# Patient Record
Sex: Female | Born: 1970 | Hispanic: Yes | Marital: Married | Smoking: Never smoker
Health system: Southern US, Community
[De-identification: ages and names within clinical notes are randomized; demographics above are authoritative.]

## PROBLEM LIST (undated history)

## (undated) DIAGNOSIS — I10 Essential (primary) hypertension: Secondary | ICD-10-CM

## (undated) DIAGNOSIS — E785 Hyperlipidemia, unspecified: Secondary | ICD-10-CM

## (undated) DIAGNOSIS — G4730 Sleep apnea, unspecified: Secondary | ICD-10-CM

---

## 2020-03-31 ENCOUNTER — Other Ambulatory Visit: Payer: Self-pay

## 2020-03-31 ENCOUNTER — Emergency Department
Admission: EM | Admit: 2020-03-31 | Discharge: 2020-03-31 | Disposition: A | Discharge status: Home / Self Care | Payer: HRSA Program | Attending: Emergency Medicine | Admitting: Emergency Medicine

## 2020-03-31 ENCOUNTER — Encounter: Payer: Self-pay | Admitting: Intensive Care

## 2020-03-31 ENCOUNTER — Emergency Department: Payer: HRSA Program

## 2020-03-31 DIAGNOSIS — J069 Acute upper respiratory infection, unspecified: Secondary | ICD-10-CM | POA: Insufficient documentation

## 2020-03-31 DIAGNOSIS — U071 COVID-19: Secondary | ICD-10-CM | POA: Diagnosis not present

## 2020-03-31 DIAGNOSIS — J029 Acute pharyngitis, unspecified: Secondary | ICD-10-CM | POA: Diagnosis present

## 2020-03-31 LAB — RESPIRATORY PANEL BY RT PCR (FLU A&B, COVID)
Influenza A by PCR: NEGATIVE
Influenza B by PCR: NEGATIVE
SARS Coronavirus 2 by RT PCR: POSITIVE — AB

## 2020-03-31 MED ORDER — ONDANSETRON 4 MG PO TBDP: 4.0000 mg | ORAL_TABLET | Freq: Three times a day (TID) | ORAL | 0 refills | Status: AC | PRN

## 2020-03-31 MED ORDER — ALBUTEROL SULFATE HFA 108 (90 BASE) MCG/ACT IN AERS: 2.0000 | INHALATION_SPRAY | Freq: Four times a day (QID) | RESPIRATORY_TRACT | 2 refills | Status: DC | PRN

## 2020-03-31 MED ORDER — BENZONATATE 100 MG PO CAPS: 100.0000 mg | ORAL_CAPSULE | Freq: Three times a day (TID) | ORAL | 0 refills | Status: AC | PRN

## 2020-03-31 MED ORDER — ACETAMINOPHEN 325 MG PO TABS
650.0000 mg | ORAL_TABLET | Freq: Once | ORAL | Status: AC | PRN
  Administered 2020-03-31: 650 mg via ORAL
  Filled 2020-03-31: qty 2

## 2020-03-31 MED ORDER — BENZONATATE 100 MG PO CAPS: 100.0000 mg | ORAL_CAPSULE | Freq: Three times a day (TID) | ORAL | 0 refills | Status: DC | PRN

## 2020-03-31 NOTE — ED Triage Notes (Signed)
Pt c/o sore throat, chills, body aches, headache, and fever that started last Wednesday.

## 2020-03-31 NOTE — ED Notes (Signed)
sicke with congestion, runny nose, sore throat. One day low grade fever, aches since Wednesday.

## 2020-03-31 NOTE — ED Provider Notes (Signed)
Emergency Department Provider Note  ____________________________________________  Time seen: Approximately 7:57 PM  I have reviewed the triage vital signs and the nursing notes.   HISTORY  Chief Complaint Sore Throat and Chills   Historian Patient   HPI Julia Watson is a 49 y.o. female presents to the emergency department with nasal congestion, rhinorrhea, pharyngitis and low-grade fever that started 4 to 5 days ago.  Patient denies chest pain, chest tightness or abdominal pain.  She has potential sick contacts within her community.  No sick contacts within the home.  No other alleviating measures have been attempted.   History reviewed. No pertinent past medical history.   Immunizations up to date:  Yes.     History reviewed. No pertinent past medical history.  There are no problems to display for this patient.   History reviewed. No pertinent surgical history.  Prior to Admission medications   Medication Sig Start Date End Date Taking? Authorizing Provider  albuterol (VENTOLIN HFA) 108 (90 Base) MCG/ACT inhaler Inhale 2 puffs into the lungs every 6 (six) hours as needed for wheezing or shortness of breath. 03/31/20   Orvil Feil, PA-C  benzonatate (TESSALON PERLES) 100 MG capsule Take 1 capsule (100 mg total) by mouth 3 (three) times daily as needed for up to 7 days for cough. 03/31/20 04/07/20  Orvil Feil, PA-C  ondansetron (ZOFRAN ODT) 4 MG disintegrating tablet Take 1 tablet (4 mg total) by mouth every 8 (eight) hours as needed for up to 5 days. 03/31/20 04/05/20  Orvil Feil, PA-C    Allergies Patient has no known allergies.  History reviewed. No pertinent family history.  Social History Social History   Tobacco Use  . Smoking status: Never Smoker  . Smokeless tobacco: Never Used  Substance Use Topics  . Alcohol use: Never  . Drug use: Never     Review of Systems  Constitutional: Patient has fever.  Eyes: No visual changes. No  discharge ENT: Patient has congestion.  Cardiovascular: no chest pain. Respiratory: Patient has cough.  Gastrointestinal: No abdominal pain.  No nausea, no vomiting. Patient had diarrhea.  Genitourinary: Negative for dysuria. No hematuria Musculoskeletal: Patient has myalgias.  Skin: Negative for rash, abrasions, lacerations, ecchymosis. Neurological: Patient has headache, no focal weakness or numbness.       ____________________________________________   PHYSICAL EXAM:  VITAL SIGNS: ED Triage Vitals  Enc Vitals Group     BP 03/31/20 1534 (!) 138/103     Pulse Rate 03/31/20 1534 (!) 102     Resp 03/31/20 1534 16     Temp 03/31/20 1534 (!) 100.4 F (38 C)     Temp Source 03/31/20 1534 Oral     SpO2 03/31/20 1534 100 %     Weight 03/31/20 1532 115 lb (52.2 kg)     Height 03/31/20 1532 5\' 1"  (1.549 m)     Head Circumference --      Peak Flow --      Pain Score 03/31/20 1534 8     Pain Loc --      Pain Edu? --      Excl. in GC? --     Constitutional: Alert and oriented. Patient is lying supine. Eyes: Conjunctivae are normal. PERRL. EOMI. Head: Atraumatic. ENT:      Ears: Tympanic membranes are mildly injected with mild effusion bilaterally.       Nose: No congestion/rhinnorhea.      Mouth/Throat: Mucous membranes are moist. Posterior pharynx is mildly  erythematous.  Hematological/Lymphatic/Immunilogical: No cervical lymphadenopathy.  Cardiovascular: Normal rate, regular rhythm. Normal S1 and S2.  Good peripheral circulation. Respiratory: Normal respiratory effort without tachypnea or retractions. Lungs CTAB. Good air entry to the bases with no decreased or absent breath sounds. Gastrointestinal: Bowel sounds 4 quadrants. Soft and nontender to palpation. No guarding or rigidity. No palpable masses. No distention. No CVA tenderness. Musculoskeletal: Full range of motion to all extremities. No gross deformities appreciated. Neurologic:  Normal speech and language. No  gross focal neurologic deficits are appreciated.  Skin:  Skin is warm, dry and intact. No rash noted. Psychiatric: Mood and affect are normal. Speech and behavior are normal. Patient exhibits appropriate insight and judgement.    ____________________________________________   LABS (all labs ordered are listed, but only abnormal results are displayed)  Labs Reviewed  RESPIRATORY PANEL BY RT PCR (FLU A&B, COVID) - Abnormal; Notable for the following components:      Result Value   SARS Coronavirus 2 by RT PCR POSITIVE (*)    All other components within normal limits   ____________________________________________  EKG   ____________________________________________  RADIOLOGY Geraldo Pitter, personally viewed and evaluated these images (plain radiographs) as part of my medical decision making, as well as reviewing the written report by the radiologist.  No consolidations, opacities or infiltrates.  No results found.  ____________________________________________    PROCEDURES  Procedure(s) performed:     Procedures     Medications  acetaminophen (TYLENOL) tablet 650 mg (650 mg Oral Given 03/31/20 1538)     ____________________________________________   INITIAL IMPRESSION / ASSESSMENT AND PLAN / ED COURSE  Pertinent labs & imaging results that were available during my care of the patient were reviewed by me and considered in my medical decision making (see chart for details).      Assessment and plan COVID-[age] year old female presents to the emergency department with rhinorrhea, nasal congestion, pharyngitis and body aches.  Vital signs are reassuring at triage.  On physical exam, patient was alert, active and nontoxic-appearing with no increased work of breathing  No consolidations, opacities or infiltrates were identified on chest x-ray.  Patient tested positive for COVID-19 in the ED.  Rest and hydration were encouraged at home.  Patient was  discharged with an albuterol inhaler, Tessalon Perles and Zofran.  Return precautions were given to return with new or worsening symptoms.    ____________________________________________  FINAL CLINICAL IMPRESSION(S) / ED DIAGNOSES  Final diagnoses:  Viral URI with cough      NEW MEDICATIONS STARTED DURING THIS VISIT:  ED Discharge Orders         Ordered    albuterol (VENTOLIN HFA) 108 (90 Base) MCG/ACT inhaler  Every 6 hours PRN        03/31/20 1807    benzonatate (TESSALON PERLES) 100 MG capsule  3 times daily PRN,   Status:  Discontinued        03/31/20 1807    ondansetron (ZOFRAN ODT) 4 MG disintegrating tablet  Every 8 hours PRN        03/31/20 1807    benzonatate (TESSALON PERLES) 100 MG capsule  3 times daily PRN        03/31/20 1807              This chart was dictated using voice recognition software/Dragon. Despite best efforts to proofread, errors can occur which can change the meaning. Any change was purely unintentional.     Orvil Feil, PA-C 03/31/20  2000    Gilles Chiquito, MD 03/31/20 2049

## 2020-04-01 ENCOUNTER — Telehealth: Payer: Self-pay | Admitting: Emergency Medicine

## 2020-04-01 NOTE — Telephone Encounter (Signed)
Called patient back regarding covid test.  She is aware of result.

## 2020-09-29 ENCOUNTER — Encounter: Payer: Self-pay | Admitting: Physician Assistant

## 2020-09-29 ENCOUNTER — Other Ambulatory Visit: Payer: Self-pay

## 2020-09-29 ENCOUNTER — Ambulatory Visit: Payer: Medicaid Other | Admitting: Physician Assistant

## 2020-09-29 DIAGNOSIS — Z1212 Encounter for screening for malignant neoplasm of rectum: Secondary | ICD-10-CM

## 2020-09-29 DIAGNOSIS — Z1231 Encounter for screening mammogram for malignant neoplasm of breast: Secondary | ICD-10-CM | POA: Diagnosis not present

## 2020-09-29 DIAGNOSIS — Z7689 Persons encountering health services in other specified circumstances: Secondary | ICD-10-CM | POA: Diagnosis not present

## 2020-09-29 DIAGNOSIS — Z1211 Encounter for screening for malignant neoplasm of colon: Secondary | ICD-10-CM | POA: Diagnosis not present

## 2020-09-29 DIAGNOSIS — R5383 Other fatigue: Secondary | ICD-10-CM

## 2020-09-29 DIAGNOSIS — R000 Tachycardia, unspecified: Secondary | ICD-10-CM

## 2020-09-29 NOTE — Progress Notes (Signed)
[facility] [address] [place], [name] [address]  Internal MEDICINE  Office Visit Note  Patient Name: Julia Watson  [id number]  [id number]  Date of Service: 10/01/2020   Complaints/HPI Pt is here for establishment of PCP. Chief Complaint  Patient presents with  . New Patient (Initial Visit)    Rash on hands, coming and going, started over 3 months ago, allergies  . Quality Metric Gaps    Pap, colonoscopy   HPI Pt is here to establish care. She hasnt been to a doctor in years and wanted to check in. -Pt has hx of 4 c sections and reports high BP with last pregnancy but nothing since then.  -Pt does not take any medications other than an OTC allergy pill -Pt stays busy at the house cooking and cleaning -Children 29, 15,11, 9 -colonoscopy, mammogram, and pap all due -Hx of tubal ligation -Covid in Oct 2021, felt really tired then but this has gotten better. -menstrual cycle is regular -Hx of rash on hands but this has resolved recently -does note 1 or 2 episodes of heart racing but denies this currently  Current Medication: Outpatient Encounter Medications as of 09/29/2020  Medication Sig  . [DISCONTINUED] albuterol (VENTOLIN HFA) 108 (90 Base) MCG/ACT inhaler Inhale 2 puffs into the lungs every 6 (six) hours as needed for wheezing or shortness of breath. (Patient not taking: Reported on 09/29/2020)   No facility-administered encounter medications on file as of 09/29/2020.    Surgical History: Past Surgical History:  Procedure Laterality Date  . CESAREAN SECTION      Medical History: History reviewed. No pertinent past medical history.  Family History: Family History  Problem Relation Age of Onset  . Arthritis Mother     Social History   Socioeconomic History  . Marital status: Married    Spouse name: Not on file  . Number of children: Not on file  . Years of education: Not on file  . Highest education level: Not on file  Occupational History  .  Not on file  Tobacco Use  . Smoking status: Never Smoker  . Smokeless tobacco: Never Used  Substance and Sexual Activity  . Alcohol use: Never  . Drug use: Never  . Sexual activity: Not on file  Other Topics Concern  . Not on file  Social History Narrative  . Not on file   Social Determinants of Health   Financial Resource Strain: Not on file  Food Insecurity: Not on file  Transportation Needs: Not on file  Physical Activity: Not on file  Stress: Not on file  Social Connections: Not on file  Intimate Partner Violence: Not on file     Review of Systems  Constitutional: Negative for chills, fatigue and unexpected weight change.  HENT: Negative for congestion, postnasal drip, rhinorrhea, sneezing and sore throat.   Eyes: Negative for redness.  Respiratory: Negative for cough, chest tightness and shortness of breath.   Cardiovascular: Negative for chest pain and palpitations.  Gastrointestinal: Negative for abdominal pain, constipation, diarrhea, nausea and vomiting.  Genitourinary: Negative for dysuria and frequency.  Musculoskeletal: Negative for arthralgias, back pain, joint swelling and neck pain.  Skin: Negative for rash.  Neurological: Negative.  Negative for tremors and numbness.  Hematological: Negative for adenopathy. Does not bruise/bleed easily.  Psychiatric/Behavioral: Negative for behavioral problems (Depression), sleep disturbance and suicidal ideas. The patient is not nervous/anxious.     Vital Signs: BP 134/88   Pulse 100   Temp (!) 97.4  F (36.3 C)   Resp 16   Ht 5' (1.524 m)   Wt 126 lb 9.6 oz (57.4 kg)   SpO2 98%   BMI 24.72 kg/m    Physical Exam Vitals and nursing note reviewed.  Constitutional:      General: She is not in acute distress.    Appearance: She is well-developed and normal weight. She is not diaphoretic.  HENT:     Head: Normocephalic and atraumatic.     Mouth/Throat:     Pharynx: No oropharyngeal exudate.  Eyes:     Pupils:  Pupils are equal, round, and reactive to light.  Neck:     Thyroid: No thyromegaly.     Vascular: No JVD.     Trachea: No tracheal deviation.  Cardiovascular:     Rate and Rhythm: Regular rhythm. Tachycardia present.     Heart sounds: Normal heart sounds. No murmur heard. No friction rub. No gallop.   Pulmonary:     Effort: Pulmonary effort is normal. No respiratory distress.     Breath sounds: No wheezing or rales.  Chest:     Chest wall: No tenderness.  Abdominal:     General: Bowel sounds are normal.     Palpations: Abdomen is soft.  Musculoskeletal:        General: Normal range of motion.     Cervical back: Normal range of motion and neck supple.  Lymphadenopathy:     Cervical: No cervical adenopathy.  Skin:    General: Skin is warm and dry.  Neurological:     Mental Status: She is alert and oriented to person, place, and time.     Cranial Nerves: No cranial nerve deficit.  Psychiatric:        Behavior: Behavior normal.        Thought Content: Thought content normal.        Judgment: Judgment normal.       Assessment/Plan: 1. Encounter to establish care with new doctor Will order routine fasting labs to be done before next visit  2. Tachycardia Pt is tachycardic on exam without feeling it, but does note hx of palpitations on at least 2 other occasions. - EKG 12-Lead showed nonspecific T wave abnormality, will consider repeat EKG next visit or an echo for further evaluation. Will start by obtaining labs for now.  3. Encounter for screening mammogram for malignant neoplasm of breast - MM DIGITAL SCREENING BILATERAL  4. Screening for colorectal cancer - Ambulatory referral to Gastroenterology  5. Other fatigue - CBC With Differential - Comprehensive metabolic panel - TSH + free T4 - Lipid Panel With LDL/HDL Ratio - VITAMIN D 25 Hydroxy (Vit-D Deficiency, Fractures)  General Counseling: [place]ah verbalizes understanding of the findings of todays visit and  agrees with plan of treatment. I have discussed any further diagnostic evaluation that may be needed or ordered today. We also reviewed her medications today. she has been encouraged to call the office with any questions or concerns that should arise related to todays visit.    Counseling:    Orders Placed This Encounter  Procedures  . MM DIGITAL SCREENING BILATERAL  . CBC With Differential  . Comprehensive metabolic panel  . TSH + free T4  . Lipid Panel With LDL/HDL Ratio  . VITAMIN D 25 Hydroxy (Vit-D Deficiency, Fractures)  . Ambulatory referral to Gastroenterology  . EKG 12-Lead    No orders of the defined types were placed in this encounter.    This patient was  seen by Lynn Ito, PA-C in collaboration with Dr. Beverely Risen as a part of collaborative care agreement.   Time spent:30 Minutes

## 2020-10-15 ENCOUNTER — Encounter: Payer: Self-pay | Admitting: *Deleted

## 2020-11-20 ENCOUNTER — Other Ambulatory Visit: Payer: Self-pay

## 2020-11-20 ENCOUNTER — Ambulatory Visit
Admission: RE | Admit: 2020-11-20 | Discharge: 2020-11-20 | Disposition: A | Discharge status: Home / Self Care | Payer: Medicaid Other | Source: Ambulatory Visit | Attending: Physician Assistant | Admitting: Physician Assistant

## 2020-11-20 ENCOUNTER — Other Ambulatory Visit: Payer: Self-pay | Admitting: Physician Assistant

## 2020-11-20 DIAGNOSIS — Z1231 Encounter for screening mammogram for malignant neoplasm of breast: Secondary | ICD-10-CM | POA: Diagnosis present

## 2020-11-21 LAB — LIPID PANEL WITH LDL/HDL RATIO
Cholesterol, Total: 197 mg/dL (ref 100–199)
HDL: 57 mg/dL (ref >39)
LDL Chol Calc (NIH): 116 mg/dL — ABNORMAL HIGH (ref 0–99)
LDL/HDL Ratio: 2 ratio (ref 0.0–3.2)
Triglycerides: 134 mg/dL (ref 0–149)
VLDL Cholesterol Cal: 24 mg/dL (ref 5–40)

## 2020-11-21 LAB — CBC WITH DIFFERENTIAL
Basophils Absolute: 0 10*3/uL (ref 0.0–0.2)
Basos: 0 %
EOS (ABSOLUTE): 0.1 10*3/uL (ref 0.0–0.4)
Eos: 1 %
Hematocrit: 35.4 % (ref 34.0–46.6)
Hemoglobin: 11.7 g/dL (ref 11.1–15.9)
Immature Grans (Abs): 0 10*3/uL (ref 0.0–0.1)
Immature Granulocytes: 0 %
Lymphocytes Absolute: 2.3 10*3/uL (ref 0.7–3.1)
Lymphs: 28 %
MCH: 29.7 pg (ref 26.6–33.0)
MCHC: 33.1 g/dL (ref 31.5–35.7)
MCV: 90 fL (ref 79–97)
Monocytes Absolute: 0.5 10*3/uL (ref 0.1–0.9)
Monocytes: 6 %
Neutrophils Absolute: 5.2 10*3/uL (ref 1.4–7.0)
Neutrophils: 65 %
RBC: 3.94 x10E6/uL (ref 3.77–5.28)
RDW: 12.2 % (ref 11.7–15.4)
WBC: 8.2 10*3/uL (ref 3.4–10.8)

## 2020-11-21 LAB — COMPREHENSIVE METABOLIC PANEL
ALT: 56 IU/L — ABNORMAL HIGH (ref 0–32)
AST: 48 IU/L — ABNORMAL HIGH (ref 0–40)
Albumin/Globulin Ratio: 1.7 (ref 1.2–2.2)
Albumin: 4.6 g/dL (ref 3.8–4.8)
Alkaline Phosphatase: 80 IU/L (ref 44–121)
BUN/Creatinine Ratio: 18 (ref 9–23)
BUN: 10 mg/dL (ref 6–24)
Bilirubin Total: 0.3 mg/dL (ref 0.0–1.2)
CO2: 25 mmol/L (ref 20–29)
Calcium: 9.6 mg/dL (ref 8.7–10.2)
Chloride: 103 mmol/L (ref 96–106)
Creatinine, Ser: 0.57 mg/dL (ref 0.57–1.00)
Globulin, Total: 2.7 g/dL (ref 1.5–4.5)
Glucose: 89 mg/dL (ref 65–99)
Potassium: 4.8 mmol/L (ref 3.5–5.2)
Sodium: 139 mmol/L (ref 134–144)
Total Protein: 7.3 g/dL (ref 6.0–8.5)
eGFR: 112 mL/min/{1.73_m2} (ref >59)

## 2020-11-21 LAB — VITAMIN D 25 HYDROXY (VIT D DEFICIENCY, FRACTURES): Vit D, 25-Hydroxy: 29.1 ng/mL — ABNORMAL LOW (ref 30.0–100.0)

## 2020-11-21 LAB — TSH+FREE T4
Free T4: 1.12 ng/dL (ref 0.82–1.77)
TSH: 1.14 u[IU]/mL (ref 0.450–4.500)

## 2020-12-01 ENCOUNTER — Encounter: Payer: Self-pay | Admitting: Physician Assistant

## 2020-12-01 ENCOUNTER — Other Ambulatory Visit: Payer: Self-pay

## 2020-12-01 ENCOUNTER — Ambulatory Visit: Payer: Medicaid Other | Admitting: Physician Assistant

## 2020-12-01 ENCOUNTER — Ambulatory Visit (INDEPENDENT_AMBULATORY_CARE_PROVIDER_SITE_OTHER): Payer: Medicaid Other | Admitting: Physician Assistant

## 2020-12-01 VITALS — BP 130/68 | HR 79 | Temp 97.2°F | Resp 16 | Ht 60.0 in | Wt 123.6 lb

## 2020-12-01 DIAGNOSIS — R9431 Abnormal electrocardiogram [ECG] [EKG]: Secondary | ICD-10-CM

## 2020-12-01 DIAGNOSIS — E559 Vitamin D deficiency, unspecified: Secondary | ICD-10-CM

## 2020-12-01 DIAGNOSIS — R748 Abnormal levels of other serum enzymes: Secondary | ICD-10-CM | POA: Diagnosis not present

## 2020-12-01 DIAGNOSIS — E7800 Pure hypercholesterolemia, unspecified: Secondary | ICD-10-CM | POA: Diagnosis not present

## 2020-12-01 DIAGNOSIS — J301 Allergic rhinitis due to pollen: Secondary | ICD-10-CM

## 2020-12-01 MED ORDER — ROSUVASTATIN CALCIUM 5 MG PO TABS: 5.0000 mg | ORAL_TABLET | Freq: Every day | ORAL | 3 refills | Status: DC

## 2020-12-01 NOTE — Progress Notes (Signed)
[facility] [address] [place], [name] [address]  Internal MEDICINE  Office Visit Note  Patient Name: Julia Watson  [id number]  [id number]  Date of Service: 12/03/2020  Chief Complaint  Patient presents with  . Follow-up    Discuss mammogram and blood work  . Quality Metric Gaps    Pap, colonoscopy     HPI Pt is here for routine follow up. Unable to do physical today due to menstrual cycle. -She completed her mammogram and will continue with routine screening in 1 years. Has not been contacted by GI office for Colonoscopy yet--will look into this reviewed recent lab work showing low Vitamin D which she will supplement OTC. Also had high LDL and will work on improving diet and exercise and start crestor. -Elevated AST/ALT--reports she does not drink much alcohol, but does use tylenol for headaches frequently. Gets them once per week. Thinks these are due to allergies and started taking increased tylenol more recently. Taking zyrtec and benadryl. Will try nasal spray to help with allergies and will decrease tylenol use. -Discussed ordering an echo based on abnormal EKG last visit. Reports she has not been having the palpations as much anymore and reports only one instance when laying down at night where she felt a slight abnormality but nothing since. OK with scheduling echo, may need to consider Holter/zio if any more recurrences.  Current Medication: Outpatient Encounter Medications as of 12/01/2020  Medication Sig  . rosuvastatin (CRESTOR) 5 MG tablet Take 1 tablet (5 mg total) by mouth daily.   No facility-administered encounter medications on file as of 12/01/2020.    Surgical History: Past Surgical History:  Procedure Laterality Date  . CESAREAN SECTION      Medical History: History reviewed. No pertinent past medical history.  Family History: Family History  Problem Relation Age of Onset  . Arthritis Mother   . Breast cancer Neg Hx     Social History    Socioeconomic History  . Marital status: Married    Spouse name: Not on file  . Number of children: Not on file  . Years of education: Not on file  . Highest education level: Not on file  Occupational History  . Not on file  Tobacco Use  . Smoking status: Never Smoker  . Smokeless tobacco: Never Used  Substance and Sexual Activity  . Alcohol use: Never  . Drug use: Never  . Sexual activity: Not on file  Other Topics Concern  . Not on file  Social History Narrative  . Not on file   Social Determinants of Health   Financial Resource Strain: Not on file  Food Insecurity: Not on file  Transportation Needs: Not on file  Physical Activity: Not on file  Stress: Not on file  Social Connections: Not on file  Intimate Partner Violence: Not on file      Review of Systems  Constitutional: Negative for chills, fatigue and unexpected weight change.  HENT: Positive for postnasal drip. Negative for congestion, rhinorrhea, sneezing and sore throat.   Eyes: Negative for redness.  Respiratory: Negative for cough, chest tightness and shortness of breath.   Cardiovascular: Negative for chest pain and palpitations.  Gastrointestinal: Negative for abdominal pain, constipation, diarrhea, nausea and vomiting.  Genitourinary: Negative for dysuria and frequency.  Musculoskeletal: Negative for arthralgias, back pain, joint swelling and neck pain.  Skin: Negative for rash.  Allergic/Immunologic: Positive for environmental allergies.  Neurological: Positive for headaches. Negative for tremors and numbness.  Hematological:  Negative for adenopathy. Does not bruise/bleed easily.  Psychiatric/Behavioral: Negative for behavioral problems (Depression), sleep disturbance and suicidal ideas. The patient is not nervous/anxious.     Vital Signs: BP 130/68   Pulse 73   Temp (!) 97.4 F (36.3 C)   Resp 16   Ht 5' (1.524 m)   Wt 123 lb 9.6 oz (56.1 kg)   LMP 11/05/2020   SpO2 99%   BMI 24.14  kg/m    Physical Exam Vitals and nursing note reviewed.  Constitutional:      General: She is not in acute distress.    Appearance: She is well-developed and normal weight. She is not diaphoretic.  HENT:     Head: Normocephalic and atraumatic.     Mouth/Throat:     Pharynx: No oropharyngeal exudate.  Eyes:     Pupils: Pupils are equal, round, and reactive to light.  Neck:     Thyroid: No thyromegaly.     Vascular: No JVD.     Trachea: No tracheal deviation.  Cardiovascular:     Rate and Rhythm: Normal rate and regular rhythm.     Heart sounds: Normal heart sounds. No murmur heard. No friction rub. No gallop.   Pulmonary:     Effort: Pulmonary effort is normal. No respiratory distress.     Breath sounds: No wheezing or rales.  Chest:     Chest wall: No tenderness.  Abdominal:     General: Bowel sounds are normal.     Palpations: Abdomen is soft.  Musculoskeletal:        General: Normal range of motion.     Cervical back: Normal range of motion and neck supple.  Lymphadenopathy:     Cervical: No cervical adenopathy.  Skin:    General: Skin is warm and dry.  Neurological:     Mental Status: She is alert and oriented to person, place, and time.     Cranial Nerves: No cranial nerve deficit.  Psychiatric:        Behavior: Behavior normal.        Thought Content: Thought content normal.        Judgment: Judgment normal.        Assessment/Plan: 1. Pure hypercholesterolemia Will start crestor--may take every other day. Will work on improving diet and exercise. - rosuvastatin (CRESTOR) 5 MG tablet; Take 1 tablet (5 mg total) by mouth daily.  Dispense: 90 tablet; Refill: 3  2. Abnormal EKG Will order an echo for further evaluation. May consider heart monitor if recurrence of tachycardia/palpitations - ECHOCARDIOGRAM COMPLETE - ECHOCARDIOGRAM COMPLETE; Future  3. Vitamin D deficiency Will supplment OTC  4. Elevated liver enzymes Will limit tylenol intake and  continue to monitor  5. Seasonal allergic rhinitis due to pollen Will continue zyrtec and add nasal spray such as flonase   General Counseling: Yajaira verbalizes understanding of the findings of todays visit and agrees with plan of treatment. I have discussed any further diagnostic evaluation that may be needed or ordered today. We also reviewed her medications today. she has been encouraged to call the office with any questions or concerns that should arise related to todays visit.    Orders Placed This Encounter  Procedures  . ECHOCARDIOGRAM COMPLETE  . ECHOCARDIOGRAM COMPLETE    Meds ordered this encounter  Medications  . rosuvastatin (CRESTOR) 5 MG tablet    Sig: Take 1 tablet (5 mg total) by mouth daily.    Dispense:  90 tablet    Refill:  3    This patient was seen by Lynn Ito, PA-C in collaboration with Dr. Beverely Risen as a part of collaborative care agreement.   Total time spent:30 Minutes Time spent includes review of chart, medications, test results, and follow up plan with the patient.      Dr Lyndon Code Internal medicine

## 2020-12-17 ENCOUNTER — Other Ambulatory Visit: Payer: Medicaid Other

## 2021-01-14 ENCOUNTER — Other Ambulatory Visit: Payer: Medicaid Other

## 2021-01-15 ENCOUNTER — Telehealth: Payer: Self-pay

## 2021-01-15 NOTE — Telephone Encounter (Signed)
Spoke to [employer], echo had already been denied and peer to peer was also denied. We have to exhaust all appeals and the insurance company may need a letter or verbal appeal. They will resend the denial letter for [place] to review and complete tasks. Spoke to pt and informed her of the situation, and let her know we are working on it and will keep her updated. Spoke to [place] and [employer] and they are aware of the situation as well.

## 2021-01-19 ENCOUNTER — Other Ambulatory Visit: Payer: Medicaid Other | Admitting: Physician Assistant

## 2021-01-28 ENCOUNTER — Telehealth: Payer: Self-pay

## 2021-01-28 NOTE — Telephone Encounter (Signed)
Spoke to [employer], [place] had started appeal request for echo and I faxed it to [employer] as well on 01/23/21. [employer] sent a note on 01/26/21 saying they don't handel the appeal and they forwarded it to [employer]. I faxed it in as well on 01-27-21 after speaking to [employer]. At [facility] and they provided fax number to [employer]. Called [employer] 01/28/21 to confirm they received it and they stated they do not handel the appeal and provided me a number to call to check status on the appeal with [facility], [phone]. Per [facility] they have received all notes and appeal request on 01/27/21 fax number [phone].   Spoke to pt made her aware of the situation and let her know appeals dept has received request and they've said it can take up to 30 days to approve request from the day they received it, which is on 01/27/21.

## 2021-01-30 ENCOUNTER — Telehealth: Payer: Self-pay

## 2021-01-30 NOTE — Telephone Encounter (Signed)
Spoke to [employer], they said they have not received any appeal even though [employer] had forwarded it to them on 01/26/21. Faxed appeal to [phone] [employer], and will follow up with them next week. They said they handle the first appeal and the previous rep may have given me incorrect info.  Spoke to pt, she is aware of the situation and will call on Monday to reschedule echo.

## 2021-02-04 ENCOUNTER — Other Ambulatory Visit: Payer: Medicaid Other

## 2021-02-23 ENCOUNTER — Encounter: Payer: Self-pay | Admitting: Physician Assistant

## 2021-02-23 ENCOUNTER — Other Ambulatory Visit: Payer: Self-pay

## 2021-02-23 ENCOUNTER — Ambulatory Visit: Payer: Medicaid Other | Admitting: Physician Assistant

## 2021-02-23 DIAGNOSIS — R9431 Abnormal electrocardiogram [ECG] [EKG]: Secondary | ICD-10-CM | POA: Diagnosis not present

## 2021-02-23 DIAGNOSIS — Z124 Encounter for screening for malignant neoplasm of cervix: Secondary | ICD-10-CM | POA: Diagnosis not present

## 2021-02-23 DIAGNOSIS — Z1211 Encounter for screening for malignant neoplasm of colon: Secondary | ICD-10-CM

## 2021-02-23 DIAGNOSIS — Z0001 Encounter for general adult medical examination with abnormal findings: Secondary | ICD-10-CM

## 2021-02-23 DIAGNOSIS — E7800 Pure hypercholesterolemia, unspecified: Secondary | ICD-10-CM

## 2021-02-23 DIAGNOSIS — Z1212 Encounter for screening for malignant neoplasm of rectum: Secondary | ICD-10-CM

## 2021-02-23 DIAGNOSIS — E559 Vitamin D deficiency, unspecified: Secondary | ICD-10-CM

## 2021-02-23 DIAGNOSIS — Z113 Encounter for screening for infections with a predominantly sexual mode of transmission: Secondary | ICD-10-CM

## 2021-02-23 DIAGNOSIS — R300 Dysuria: Secondary | ICD-10-CM

## 2021-02-23 NOTE — Progress Notes (Signed)
[facility] [address] [place], [name] [address]  Internal MEDICINE  Office Visit Note  Patient Name: Julia Watson  [id number]  [id number]  Date of Service: 03/01/2021  Chief Complaint  Patient presents with   Annual Exam   Quality Metric Gaps    Colonoscopy      HPI Pt is here for routine health maintenance examination -Does have some palpitations at times, sometimes while laying down relaxing and other times when up and working around house -Echo scheduled in Oct now that insurance has approved, discussed we may need to consider holter monitor as well if ongoing -Did not start Vit D supplement, will start now -Will pick up cholesterol med and start this now as well -Labs reviewed last visit -Did finish her cycle on Friday, but did have some blood present on pelvic exam -Mammogram UTD, colonoscopy referral already placed  Current Medication: Outpatient Encounter Medications as of 02/23/2021  Medication Sig   rosuvastatin (CRESTOR) 5 MG tablet Take 1 tablet (5 mg total) by mouth daily. (Patient not taking: Reported on 02/23/2021)   No facility-administered encounter medications on file as of 02/23/2021.    Surgical History: Past Surgical History:  Procedure Laterality Date   CESAREAN SECTION      Medical History: History reviewed. No pertinent past medical history.  Family History: Family History  Problem Relation Age of Onset   Arthritis Mother    Breast cancer Neg Hx       Review of Systems  Constitutional:  Negative for chills, fatigue and unexpected weight change.  HENT:  Negative for congestion, postnasal drip, rhinorrhea, sneezing and sore throat.   Eyes:  Negative for redness.  Respiratory:  Negative for cough, chest tightness and shortness of breath.   Cardiovascular:  Negative for chest pain and palpitations.  Gastrointestinal:  Negative for abdominal pain, constipation, diarrhea, nausea and vomiting.  Genitourinary:  Negative for  dysuria and frequency.  Musculoskeletal:  Negative for arthralgias, back pain, joint swelling and neck pain.  Skin:  Negative for rash.  Neurological: Negative.  Negative for tremors and numbness.  Hematological:  Negative for adenopathy. Does not bruise/bleed easily.  Psychiatric/Behavioral:  Negative for behavioral problems (Depression), sleep disturbance and suicidal ideas. The patient is not nervous/anxious.     Vital Signs: BP 128/80   Pulse 78   Temp 97.8 F (36.6 C)   Resp 16   Ht 5' (1.524 m)   Wt 119 lb (54 kg)   SpO2 98%   BMI 23.24 kg/m    Physical Exam Vitals and nursing note reviewed.  Constitutional:      General: She is not in acute distress.    Appearance: She is well-developed and normal weight. She is not diaphoretic.  HENT:     Head: Normocephalic and atraumatic.     Right Ear: External ear normal.     Left Ear: External ear normal.     Nose: Nose normal.     Mouth/Throat:     Pharynx: No oropharyngeal exudate.  Eyes:     General: No scleral icterus.       Right eye: No discharge.        Left eye: No discharge.     Conjunctiva/sclera: Conjunctivae normal.     Pupils: Pupils are equal, round, and reactive to light.  Neck:     Thyroid: No thyromegaly.     Vascular: No JVD.     Trachea: No tracheal deviation.  Cardiovascular:     Rate  and Rhythm: Normal rate and regular rhythm.     Heart sounds: Normal heart sounds. No murmur heard.   No friction rub. No gallop.  Pulmonary:     Effort: Pulmonary effort is normal. No respiratory distress.     Breath sounds: Normal breath sounds. No stridor. No wheezing or rales.  Chest:     Chest wall: No tenderness.  Breasts:    Right: Normal.     Left: Normal.  Abdominal:     General: Bowel sounds are normal. There is no distension.     Palpations: Abdomen is soft. There is no mass.     Tenderness: There is no abdominal tenderness. There is no guarding or rebound.  Genitourinary:    Exam position: Lithotomy  position.     Cervix: Cervical bleeding present.  Musculoskeletal:        General: No tenderness or deformity. Normal range of motion.     Cervical back: Normal range of motion and neck supple.  Lymphadenopathy:     Cervical: No cervical adenopathy.  Skin:    General: Skin is warm and dry.     Coloration: Skin is not pale.     Findings: No erythema or rash.  Neurological:     Mental Status: She is alert.     Cranial Nerves: No cranial nerve deficit.     Motor: No abnormal muscle tone.     Coordination: Coordination normal.     Deep Tendon Reflexes: Reflexes are normal and symmetric.  Psychiatric:        Behavior: Behavior normal.        Thought Content: Thought content normal.        Judgment: Judgment normal.     LABS: Recent Results (from the past 2160 hour(s))  UA/M w/rflx Culture, Routine     Status: Abnormal   Collection Time: 02/23/21  9:53 AM   Specimen: Urine   Urine  Result Value Ref Range   Specific Gravity, UA 1.021 1.005 - 1.030   pH, UA 5.0 5.0 - 7.5   Color, UA Yellow Yellow   Appearance Ur Turbid (A) Clear   Leukocytes,UA Negative Negative   Protein,UA Negative Negative/Trace   Glucose, UA Negative Negative   Ketones, UA Negative Negative   RBC, UA 2+ (A) Negative   Bilirubin, UA Negative Negative   Urobilinogen, Ur 0.2 0.2 - 1.0 mg/dL   Nitrite, UA Negative Negative   Microscopic Examination See below:     Comment: Microscopic was indicated and was performed.   Urinalysis Reflex Comment     Comment: This specimen has reflexed to a Urine Culture.  NuSwab Vaginitis Plus (VG+)     Status: Abnormal   Collection Time: 02/23/21  9:53 AM  Result Value Ref Range   Atopobium vaginae High - 2 (A) Score   BVAB 2 High - 2 (A) Score   Megasphaera 1 High - 2 (A) Score    Comment: Calculate total score by adding the 3 individual bacterial vaginosis (BV) marker scores together.  Total score is interpreted as follows: Total score 0-1: Indicates the absence of  BV. Total score   2: Indeterminate for BV. Additional clinical                  data should be evaluated to establish a                  diagnosis. Total score 3-6: Indicates the presence of BV. This test was developed and  its performance characteristics determined by [facility].  It has not been cleared or approved by the Food and Drug Administration.    Candida albicans, NAA Negative Negative   Candida glabrata, NAA Negative Negative   Trich vag by NAA Negative Negative   Chlamydia trachomatis, NAA Negative Negative   Neisseria gonorrhoeae, NAA Negative Negative  IGP, Aptima HPV     Status: None   Collection Time: 02/23/21  9:53 AM  Result Value Ref Range   Interpretation NILM     Comment: NEGATIVE FOR INTRAEPITHELIAL LESION OR MALIGNANCY.   Category NIL     Comment: Negative for Intraepithelial Lesion   Adequacy ENDO     Comment: Satisfactory for evaluation. Endocervical and/or squamous metaplastic cells (endocervical component) are present.    Clinician Provided ICD10 Comment     Comment: Z12.4   Performed by: Comment     Comment: Calton Dach, Supervisory Cytotechnologist (ASCP)   Note: Comment     Comment: The Pap smear is a screening test designed to aid in the detection of premalignant and malignant conditions of the uterine cervix.  It is not a diagnostic procedure and should not be used as the sole means of detecting cervical cancer.  Both false-positive and false-negative reports do occur.    Test Methodology Comment     Comment: This liquid based ThinPrep(R) pap test was screened with the use of an image guided system.    HPV Aptima Negative Negative    Comment: This nucleic acid amplification test detects fourteen high-risk HPV types (16,18,31,33,35,39,45,51,52,56,58,59,66,68) without differentiation.   Microscopic Examination     Status: Abnormal   Collection Time: 02/23/21  9:53 AM   Urine  Result Value Ref Range   WBC, UA 6-10 (A) 0 - 5 /hpf   RBC 0-2 0 -  2 /hpf   Epithelial Cells (non renal) 0-10 0 - 10 /hpf   Casts None seen None seen /lpf   Bacteria, UA Few None seen/Few  Urine Culture, Reflex     Status: None   Collection Time: 02/23/21  9:53 AM   Urine  Result Value Ref Range   Urine Culture, Routine Final report    Organism ID, Bacteria Comment     Comment: Culture shows less than 10,000 colony forming units of bacteria per milliliter of urine. This colony count is not generally considered to be clinically significant.        Assessment/Plan: 1. Encounter for general adult medical examination with abnormal findings CPE performed, mammogram up-to-date and Pap performed today.  Colonoscopy referral placed  2. Pure hypercholesterolemia Will start on Crestor ordered at last visit  3. Abnormal EKG Echo scheduled for further evaluation  4. Vitamin D deficiency Will start over-the-counter vitamin D supplementation  5. Routine cervical smear - IGP, Aptima HPV  6. Screening for STDs (sexually transmitted diseases) - NuSwab Vaginitis Plus (VG+)  7. Screening for colorectal cancer - Ambulatory referral to Gastroenterology  8. Dysuria - UA/M w/rflx Culture, Routine   General Counseling: Mekisha verbalizes understanding of the findings of todays visit and agrees with plan of treatment. I have discussed any further diagnostic evaluation that may be needed or ordered today. We also reviewed her medications today. she has been encouraged to call the office with any questions or concerns that should arise related to todays visit.    Counseling:    Orders Placed This Encounter  Procedures   Microscopic Examination   Urine Culture, Reflex   UA/M w/rflx Culture, Routine  NuSwab Vaginitis Plus (VG+)   Ambulatory referral to Gastroenterology    No orders of the defined types were placed in this encounter.   This patient was seen by Lynn Ito, PA-C in collaboration with Dr. Beverely Risen as a part of collaborative  care agreement.  Total time spent:35 Minutes  Time spent includes review of chart, medications, test results, and follow up plan with the patient.     Lyndon Code, MD  Internal Medicine

## 2021-02-25 LAB — NUSWAB VAGINITIS PLUS (VG+)
Atopobium vaginae: HIGH Score — AB
BVAB 2: HIGH Score — AB
Candida albicans, NAA: NEGATIVE
Candida glabrata, NAA: NEGATIVE
Chlamydia trachomatis, NAA: NEGATIVE
Megasphaera 1: HIGH Score — AB
Neisseria gonorrhoeae, NAA: NEGATIVE
Trich vag by NAA: NEGATIVE

## 2021-02-25 LAB — IGP, APTIMA HPV: HPV Aptima: NEGATIVE

## 2021-02-26 LAB — UA/M W/RFLX CULTURE, ROUTINE
Bilirubin, UA: NEGATIVE
Glucose, UA: NEGATIVE
Ketones, UA: NEGATIVE
Leukocytes,UA: NEGATIVE
Nitrite, UA: NEGATIVE
Protein,UA: NEGATIVE
Specific Gravity, UA: 1.021 (ref 1.005–1.030)
Urobilinogen, Ur: 0.2 mg/dL (ref 0.2–1.0)
pH, UA: 5 (ref 5.0–7.5)

## 2021-02-26 LAB — URINE CULTURE, REFLEX

## 2021-02-26 LAB — MICROSCOPIC EXAMINATION: Casts: NONE SEEN /lpf

## 2021-03-01 ENCOUNTER — Other Ambulatory Visit: Payer: Self-pay | Admitting: Physician Assistant

## 2021-03-01 DIAGNOSIS — B9689 Other specified bacterial agents as the cause of diseases classified elsewhere: Secondary | ICD-10-CM

## 2021-03-01 MED ORDER — METRONIDAZOLE 500 MG PO TABS: 500.0000 mg | ORAL_TABLET | Freq: Two times a day (BID) | ORAL | 0 refills | Status: DC

## 2021-03-02 ENCOUNTER — Telehealth: Payer: Self-pay

## 2021-03-02 NOTE — Telephone Encounter (Signed)
-----   Message from Carlean Jews, PA-C sent at 03/01/2021  3:03 PM EDT ----- Please let pt know she has BV and I am sending metronidazole--should not drink alcohol with this. Pap was normal.

## 2021-03-02 NOTE — Telephone Encounter (Signed)
Spoke to pt and informed her about lab results and pap results and that we sent medication to her pharmacy and pt informed not to drink alcohol while taking the medication

## 2021-03-13 ENCOUNTER — Telehealth: Payer: Self-pay

## 2021-03-13 NOTE — Telephone Encounter (Signed)
Left vm to confirm 03/18/21 appointment-Toni 

## 2021-03-16 ENCOUNTER — Telehealth: Payer: Self-pay

## 2021-03-16 ENCOUNTER — Other Ambulatory Visit (INDEPENDENT_AMBULATORY_CARE_PROVIDER_SITE_OTHER): Payer: Self-pay

## 2021-03-16 DIAGNOSIS — Z1211 Encounter for screening for malignant neoplasm of colon: Secondary | ICD-10-CM

## 2021-03-16 MED ORDER — CLENPIQ 10-3.5-12 MG-GM -GM/160ML PO SOLN: 1.0000 | ORAL | 0 refills | Status: DC

## 2021-03-16 NOTE — Progress Notes (Signed)
Gastroenterology Pre-Procedure Review  Request Date: 04/08/2021 Requesting Physician: Dr. Tobi Bastos  PATIENT REVIEW QUESTIONS: The patient responded to the following health history questions as indicated:    1. Are you having any GI issues? no 2. Do you have a personal history of Polyps? no 3. Do you have a family history of Colon Cancer or Polyps? no 4. Diabetes Mellitus? no 5. Joint replacements in the past 12 months?no 6. Major health problems in the past 3 months?no 7. Any artificial heart valves, MVP, or defibrillator?no    MEDICATIONS & ALLERGIES:    Patient reports the following regarding taking any anticoagulation/antiplatelet therapy:   Plavix, Coumadin, Eliquis, Xarelto, Lovenox, Pradaxa, Brilinta, or Effient? no Aspirin? no  Patient confirms/reports the following medications:  Current Outpatient Medications  Medication Sig Dispense Refill   rosuvastatin (CRESTOR) 5 MG tablet Take 1 tablet (5 mg total) by mouth daily. (Patient not taking: Reported on 02/23/2021) 90 tablet 3   No current facility-administered medications for this visit.    Patient confirms/reports the following allergies:  Allergies  Allergen Reactions   Pollinex-T [Modified Tree Tyrosine Adsorbate]     No orders of the defined types were placed in this encounter.   AUTHORIZATION INFORMATION Primary Insurance: 1D#: Group #:  Secondary Insurance: 1D#: Group #:  SCHEDULE INFORMATION: Date: 04/08/2021 Time: Location: [facility]

## 2021-03-16 NOTE — Progress Notes (Signed)
Didn't see a prep covered by her insurance so I sent out a coupon

## 2021-03-16 NOTE — Telephone Encounter (Signed)
Pt. Ready to schedule colonoscopy 

## 2021-03-18 ENCOUNTER — Other Ambulatory Visit: Payer: Self-pay

## 2021-03-18 ENCOUNTER — Ambulatory Visit: Payer: Medicaid Other

## 2021-03-18 DIAGNOSIS — R9431 Abnormal electrocardiogram [ECG] [EKG]: Secondary | ICD-10-CM

## 2021-04-06 ENCOUNTER — Ambulatory Visit: Payer: Medicaid Other | Admitting: Physician Assistant

## 2021-04-06 ENCOUNTER — Telehealth: Payer: Self-pay

## 2021-04-06 NOTE — Telephone Encounter (Signed)
Pt. Calling to cancel colonoscopy 

## 2021-04-08 ENCOUNTER — Encounter: Admission: RE | Payer: Self-pay | Source: Home / Self Care

## 2021-04-08 ENCOUNTER — Ambulatory Visit: Admission: RE | Admit: 2021-04-08 | Payer: Medicaid Other | Source: Home / Self Care | Admitting: Gastroenterology

## 2021-04-08 SURGERY — COLONOSCOPY WITH PROPOFOL
Anesthesia: General

## 2021-04-27 ENCOUNTER — Ambulatory Visit: Payer: Medicaid Other | Admitting: Physician Assistant

## 2021-04-27 ENCOUNTER — Other Ambulatory Visit: Payer: Self-pay

## 2021-04-27 ENCOUNTER — Encounter: Payer: Self-pay | Admitting: Physician Assistant

## 2021-04-27 DIAGNOSIS — E559 Vitamin D deficiency, unspecified: Secondary | ICD-10-CM

## 2021-04-27 DIAGNOSIS — E7800 Pure hypercholesterolemia, unspecified: Secondary | ICD-10-CM | POA: Diagnosis not present

## 2021-04-27 DIAGNOSIS — G4710 Hypersomnia, unspecified: Secondary | ICD-10-CM

## 2021-04-27 DIAGNOSIS — I5189 Other ill-defined heart diseases: Secondary | ICD-10-CM | POA: Diagnosis not present

## 2021-04-27 DIAGNOSIS — I517 Cardiomegaly: Secondary | ICD-10-CM | POA: Diagnosis not present

## 2021-04-27 DIAGNOSIS — Z111 Encounter for screening for respiratory tuberculosis: Secondary | ICD-10-CM

## 2021-04-27 NOTE — Progress Notes (Signed)
[facility] [place], [place] [address]  Internal MEDICINE  Office Visit Note  Patient Name: Julia Watson  [id number]  [id number]  Date of Service: 04/28/2021  Chief Complaint  Patient presents with   Follow-up    U/s and need chest xray     HPI Pt is here for routine follow up to review echo -Echo did show diastolic dysfunction and borderline RA dilation. EF normal. Discussed ordering a sleep study for further evaluation -Snores at night. Has startled herself awake. Feels very tired during the daytime though does not doze off usually. -Everything has been going ok.  -Taking vit D supplement, started cholesterol med some days per week.  Exercising now as well -Does request TB testing for work, but always tests positive on PPD, therefore with send for CXR EPWORTH SLEEPINESS SCALE:  Scale:  (0)= no chance of dozing; (1)= slight chance of dozing; (2)= moderate chance of dozing; (3)= high chance of dozing  Chance  Situtation    Sitting and reading: 0    Watching TV: 2    Sitting Inactive in public: 0    As a passenger in car: 0      Lying down to rest: 0    Sitting and talking: 0    Sitting quielty after lunch: 0    In a car, stopped in traffic: 0   TOTAL SCORE:   2 out of 24   Current Medication: Outpatient Encounter Medications as of 04/27/2021  Medication Sig   rosuvastatin (CRESTOR) 5 MG tablet Take 1 tablet (5 mg total) by mouth daily.   Sod Picosulfate-Mag Ox-Cit Acd (CLENPIQ) 10-3.5-12 MG-GM -GM/160ML SOLN Take 1 kit by mouth as directed. At 5 PM evening before procedure, drink 1 bottle of Clenpiq, hydrate, drink (5) 8 oz of water. Then do the same thing 5 hours prior to your procedure.   No facility-administered encounter medications on file as of 04/27/2021.    Surgical History: Past Surgical History:  Procedure Laterality Date   CESAREAN SECTION      Medical History: History reviewed. No pertinent past medical  history.  Family History: Family History  Problem Relation Age of Onset   Arthritis Mother    Breast cancer Neg Hx     Social History   Socioeconomic History   Marital status: Married    Spouse name: Not on file   Number of children: Not on file   Years of education: Not on file   Highest education level: Not on file  Occupational History   Not on file  Tobacco Use   Smoking status: Never   Smokeless tobacco: Never  Substance and Sexual Activity   Alcohol use: Never   Drug use: Never   Sexual activity: Not on file  Other Topics Concern   Not on file  Social History Narrative   Not on file   Social Determinants of Health   Financial Resource Strain: Not on file  Food Insecurity: Not on file  Transportation Needs: Not on file  Physical Activity: Not on file  Stress: Not on file  Social Connections: Not on file  Intimate Partner Violence: Not on file      Review of Systems  Constitutional:  Positive for fatigue. Negative for chills and unexpected weight change.  HENT:  Negative for congestion, postnasal drip, rhinorrhea, sneezing and sore throat.   Eyes:  Negative for redness.  Respiratory:  Negative for cough, chest tightness and shortness of breath.   Cardiovascular:  Negative for chest pain and palpitations.  Gastrointestinal:  Negative for abdominal pain, constipation, diarrhea, nausea and vomiting.  Genitourinary:  Negative for dysuria and frequency.  Musculoskeletal:  Negative for arthralgias, back pain, joint swelling and neck pain.  Skin:  Negative for rash.  Neurological: Negative.  Negative for tremors and numbness.  Hematological:  Negative for adenopathy. Does not bruise/bleed easily.  Psychiatric/Behavioral:  Positive for sleep disturbance. Negative for behavioral problems (Depression) and suicidal ideas. The patient is not nervous/anxious.    Vital Signs: BP 117/79   Pulse 77   Temp 97.8 F (36.6 C)   Resp 16   Ht 5' (1.524 m)   Wt 118 lb  (53.5 kg)   SpO2 97%   BMI 23.05 kg/m    Physical Exam Vitals and nursing note reviewed.  Constitutional:      General: She is not in acute distress.    Appearance: She is well-developed and normal weight. She is not diaphoretic.  HENT:     Head: Normocephalic and atraumatic.     Mouth/Throat:     Pharynx: No oropharyngeal exudate.  Eyes:     Pupils: Pupils are equal, round, and reactive to light.  Neck:     Thyroid: No thyromegaly.     Vascular: No JVD.     Trachea: No tracheal deviation.  Cardiovascular:     Rate and Rhythm: Normal rate and regular rhythm.     Heart sounds: Normal heart sounds. No murmur heard.   No friction rub. No gallop.  Pulmonary:     Effort: Pulmonary effort is normal. No respiratory distress.     Breath sounds: No wheezing or rales.  Chest:     Chest wall: No tenderness.  Abdominal:     General: Bowel sounds are normal.     Palpations: Abdomen is soft.  Musculoskeletal:        General: Normal range of motion.     Cervical back: Normal range of motion and neck supple.  Lymphadenopathy:     Cervical: No cervical adenopathy.  Skin:    General: Skin is warm and dry.  Neurological:     Mental Status: She is alert and oriented to person, place, and time.     Cranial Nerves: No cranial nerve deficit.  Psychiatric:        Behavior: Behavior normal.        Thought Content: Thought content normal.        Judgment: Judgment normal.       Assessment/Plan: 1. Hypersomnia Based on snoring, gasping awake, daytime sleepiness, and changes on echo will proceed with sleep study - [facility] SLEEP STUDY; Future  2. Diastolic dysfunction Will order sleep study for further eval - [facility] SLEEP STUDY; Future  3. Right atrial dilation Will order sleep study for further eval - [facility] SLEEP STUDY; Future  4. Pure hypercholesterolemia Continue crestor  5. Vitamin D deficiency Continue vit D supplement  6. Screening-pulmonary TB Will order chest xray for  screening due to prior positive PPD - DG Chest 2 View; Future   General Counseling: [place] verbalizes understanding of the findings of todays visit and agrees with plan of treatment. I have discussed any further diagnostic evaluation that may be needed or ordered today. We also reviewed her medications today. she has been encouraged to call the office with any questions or concerns that should arise related to todays visit.    Orders Placed This Encounter  Procedures   DG Chest 2 View  PSG SLEEP STUDY    No orders of the defined types were placed in this encounter.   This patient was seen by Drema Dallas, PA-C in collaboration with Dr. Clayborn Bigness as a part of collaborative care agreement.   Total time spent:30 Minutes Time spent includes review of chart, medications, test results, and follow up plan with the patient.      Dr Lavera Guise Internal medicine

## 2021-04-29 ENCOUNTER — Ambulatory Visit
Admission: RE | Admit: 2021-04-29 | Discharge: 2021-04-29 | Disposition: A | Discharge status: Home / Self Care | Payer: Medicaid Other | Attending: Physician Assistant | Admitting: Physician Assistant

## 2021-04-29 ENCOUNTER — Other Ambulatory Visit: Payer: Self-pay

## 2021-04-29 ENCOUNTER — Ambulatory Visit
Admission: RE | Admit: 2021-04-29 | Discharge: 2021-04-29 | Disposition: A | Discharge status: Home / Self Care | Payer: Medicaid Other | Source: Ambulatory Visit | Attending: Physician Assistant | Admitting: Physician Assistant

## 2021-04-29 DIAGNOSIS — Z111 Encounter for screening for respiratory tuberculosis: Secondary | ICD-10-CM | POA: Diagnosis not present

## 2021-05-04 ENCOUNTER — Telehealth: Payer: Self-pay

## 2021-05-04 NOTE — Telephone Encounter (Signed)
Spoke to pt and informed her of the xray results and will discuss further at next visit for the small calcification incidentally found.

## 2021-05-18 ENCOUNTER — Telehealth: Payer: Self-pay

## 2021-05-18 NOTE — Telephone Encounter (Signed)
PSG order was placed in the FG folder.  

## 2021-06-05 ENCOUNTER — Encounter (INDEPENDENT_AMBULATORY_CARE_PROVIDER_SITE_OTHER): Payer: Medicaid Other | Admitting: Internal Medicine

## 2021-06-05 DIAGNOSIS — G4733 Obstructive sleep apnea (adult) (pediatric): Secondary | ICD-10-CM | POA: Diagnosis not present

## 2021-06-08 ENCOUNTER — Ambulatory Visit: Payer: Medicaid Other | Admitting: Physician Assistant

## 2021-06-16 DIAGNOSIS — G4733 Obstructive sleep apnea (adult) (pediatric): Secondary | ICD-10-CM | POA: Insufficient documentation

## 2021-06-16 NOTE — Procedures (Signed)
[facility]  Portable Polysomnogram Report Part 1 \\Phone : [phone] Fax: [phone]  Patient Name: Julia Watson Recording Device: Tawana Scale  D.O.B.: [birth year] Acquisition Number: [id number]  Referring Physician: Lynn Ito. PA-C Acquisition Date: 06/05/2021   History: The patient is a 50 years old female who was referred for evaluation of possible sleep apnea.  Medical History: diastolic dysfunction, hypercholesterolemia, vitamin D deficiency.  Medications: Crestor.  PROCEDURE  The unattended portable polysomnogram was conducted on the night of 06/05/2021.  The following parameters were monitored: Nasal and oral airflow, and body position. Additionally, thoracic and abdominal movements were recorded by inductance plethysmography. Oxygen saturation (SpO2) and heart rate (ECG) was monitored using a pulse Oximeter.  The tracing was scored using 30 second epochs. Hypopneas were scored per AASM definition VIIID1.B (4% desaturation).    Description: The total recording time was 593.3 minutes. Sleep parameters are not recorded.  Respiratory monitoring demonstrated significant snoring across the night. Virtually all of the recording was in the supine position. There were a total of 75 apneas and hypopneas for a Respiratory Event Index of 7.6 apneas and hypopneas per hour of recording. The average duration of the respiratory events was 25.4 seconds with a maximum duration of 61.0 seconds. The respiratory events were associated with peripheral oxygen desaturations on the average to 96 %. The lowest oxygen desaturation associated with a respiratory event was 74 %. Additionally, the mean oxygen saturation was 96 %. The total duration of oxygen < 90% was 3.6 minutes and <80% was 0.2 minutes.   Cardiac monitoring- The average heart rate during the recording was 61.9 bpm.  Impression: This routine overnight portable polysomnogram demonstrated the presence of  obstructive sleep apnea. Overall the Respiratory Event Index was 7.6 apneas and hypopneas per hour of recording with virtually all of the recording in the supine position.   Recommendations:     A CPAP titration in the supine position would be recommended due to the severity of the sleep apnea.  Yevonne Pax, MD [facility] Diplomate ABMS Pulmonary Critical Care and Sleep Medicine Electronically reviewed and digitally signed   [facility]  Portable Polysomnogram Report Part 2 Phone: [phone] Fax: [phone]    Study Date: 06/05/2021  Patient Name: Julia Barre, Watson Recording Device: Tawana Scale  Sex: F Height: 60.0 in.  D.O.B.: [birth year] Weight: 125.0 lbs.  Age: 50 years B.M.I: 24.4 lb/in2   Times and Durations  Lights off clock time:  10:06:54 PM Total Recording Time (TRT): 593.3 minutes  Lights on clock time: 8:00:12 AM Time In Bed (TIB): 593.3 minutes   Summary  AHI 7.6 OAI 5.0 CAI 0.0 Lowest Desat 75  AHI is the number of apneas and hypopneas per hour. OAI is the number of obstructive apneas per hour. CAI is the number of central apneas per hour. Lowest Desat is the lowest blood oxygen level that lasted at least 2 seconds.  RESPIRATORY EVENTS   Index (#/hour) Total # of Events Mean duration  (sec) Max duration  (sec) # of Events by Position       Supine Prone Left Right Up  Central Apneas 0.0 0 0.0 0.0 0       0 0  Obstructive Apneas 5.0 49 21.0 57.5 49       0 0  Mixed Apneas 0.2 2 16.3 17.5 2       0 0  Hypopneas 2.4 24 35.1 61.0 24       0  0  Apneas + Hypopneas 7.6 75 25.4 61.0 75       0 0  Total 7.6 75 25.4 61.0 75       0 0  Time in Position 584.6       4.7 0.2  AHI in Position 7.7       0.0 0.0    Oximetry Summary   Dur. (min) % TIB  <90 % 3.6 0.6  <85 % 0.9 0.2  <80 % 0.2 0.0  <70 % 0.0 0.0  Total Dur (min) < 89 2.8 min  Average (%) 96  Total # of Desats 98  Desat Index (#/hour) 10.0  Desat Max (%) 23  Desat Max dur (sec) 109.0  Lowest  SpO2 % during sleep 74  Duration of Min SpO2 (sec) 1    Heart Rate Stats  Mean HR during sleep (BPM)  Highest HR during sleep 95  (BPM)  Highest HR during TIB  101 (BPM)    Snoring Summary  Total Snoring Episodes 90  Total Duration with Snoring 41.4 minutes  Mean Duration of Snoring 27.6 seconds  Percentage of Snoring 7.0 %

## 2021-07-01 ENCOUNTER — Encounter (INDEPENDENT_AMBULATORY_CARE_PROVIDER_SITE_OTHER): Payer: Medicaid Other | Admitting: Internal Medicine

## 2021-07-01 DIAGNOSIS — G4733 Obstructive sleep apnea (adult) (pediatric): Secondary | ICD-10-CM

## 2021-07-06 NOTE — Procedures (Signed)
[facility]  Polysomnogram Report Part I  Phone: [phone] Fax: [phone]  Patient Name: Julia Watson, Julia Watson Acquisition Number: [id number]  Date of Birth: May 12, 1971 Acquisition Date: 07/01/2021  Referring Physician: Lynn Ito, PA-C     History: The patient is a 51 year old female with obstructive sleep apnea for CPAP titration. Medical History: hypersomnia, diastolic dysfunction, right atrial dilation, hypercholesterolemia and vitamin D deficiency.  Medications: Crestor   Procedure: This routine overnight polysomnogram was performed on the [facility] 5 using the standard CPAP protocol. This included 6 channels of EEG, 2 channels of EOG, chin EMG, bilateral anterior tibialis EMG, nasal/oral thermistor, PTAF (nasal pressure transducer), chest and abdominal wall movements, EKG, and pulse oximetry.  Description: The total recording time was 375.1 minutes. The total sleep time was 355.5 minutes. There were a total of 16.7 minutes of wakefulness after sleep onset for a goodsleep efficiency of 94.8%. The latency to sleep onset was short at 2.9 minutes. The R sleep onset latency was within normal limits at 73.0 minutes. Sleep parameters, as a percentage of the total sleep time, demonstrated 16.5% of sleep was in N1 sleep, 42.2% N2, 16.5% N3 and 24.9% R sleep. There were a total of 134 arousals for an arousal index of 22.6 arousals per hour of sleep that was elevated.  The patient did well at all CPAP pressures. There were no respiratory events recorded during the pressure titration. CPAP was initiated at 5 cm H2O at lights out, 12:09 a.m. It was titrated in 1 cm increments to the final pressure of 7 cm H2O for snoring. All sleep was in the supine position and REM sleep was observed.  Additionally, the baseline oxygen saturation during wakefulness was 98%, during NREM sleep averaged 98%, and during REM sleep averaged 98%. The total duration of oxygen < 90% was 0.0  minutes.  Cardiac monitoring-  There were no significant cardiac rhythm irregularities.   Periodic limb movement monitoring- did not demonstrate periodic limb movements.   Impression: This patient's obstructive sleep apnea demonstrated significant improvement with the utilization of nasal CPAP at 7 cm H2O.      Recommendations: Would recommend utilization of nasal CPAP at 7 cm H2O.      A small ResMed Airfit P10 maskwas used. Chin strap used during study- no. Humidifier used during study- yes.     Yevonne Pax, MD, [facility] Diplomate ABMS-Pulmonary, Critical Care and Sleep Medicine  Electronically reviewed and digitally signed   [facility] CPAP/BIPAP Polysomnogram Report Part II Phone: [phone] Fax: [phone]  Patient last name Julia Watson Neck Size 14.0 in. Acquisition [phone]  Patient first name Julia Watson Weight 125.0 lbs. Started 07/01/2021 at 12:04:37 AM  Birth date Nov 05, 1970 Height 60.0 in. Stopped 07/01/2021 at 6:27:19 AM  Age 69      Type Adult BMI 24.4 lb/in2 Duration 375.1  Report generated by Ramon Dredge., RPSGT Reviewed by: Valentino Hue. Henke, PhD, ABSM, FAASM Sleep Data: Lights Out: 12:09:43 AM Sleep Onset: 12:12:37 AM  Lights On: 6:24:49 AM Sleep Efficiency: 94.8 %  Total Recording Time: 375.1 min Sleep Latency (from Lights Off) 2.9 min  Total Sleep Time (TST): 355.5 min R Latency (from Sleep Onset): 73.0 min  Sleep Period Time: 368.0 min Total number of awakenings: 17  Wake during sleep: 12.5 min Wake After Sleep Onset (WASO): 16.7 min   Sleep Data:         Arousal Summary: Stage  Latency from lights out (min) Latency  from sleep onset (min) Duration (min) % Total Sleep Time  Normal values  N 1 2.9 0.0 58.5 16.5 (5%)  N 2 12.9 10.0 150.0 42.2 (50%)  N 3 21.4 18.5 58.5 16.5 (20%)  R 75.9 73.0 88.5 24.9 (25%)    Number Index  Spontaneous 104 17.6  Apneas & Hypopneas 0 0.0  RERAs 0 0.0       (Apneas & Hypopneas & RERAs)  (0) (0.0)  Limb Movement 30 5.1   Snore 0 0.0  TOTAL 134 22.6     Respiratory Data:  CA OA MA Apnea Hypopnea* A+ H RERA Total  Number 0 0 0 0 0 0 0 0  Mean Dur (sec) 0.0 0.0 0.0 0.0 0.0 0.0 0.0 0.0  Max Dur (sec) 0.0 0.0 0.0 0.0 0.0 0.0 0.0 0.0  Total Dur (min) 0.0 0.0 0.0 0.0 0.0 0.0 0.0 0.0  % of TST 0.0 0.0 0.0 0.0 0.0 0.0 0.0 0.0  Index (#/h TST) 0.0 0.0 0.0 0.0 0.0 0.0 0.0 0.0  *Hypopneas scored based on 4% or greater desaturation.  Sleep Stage:         REM NREM TST  AHI 0.0 0.0 0.0  RDI 0.0 0.0 0.0    Sleep (min) TST (%) REM (min) NREM (min) CA (#) OA (#) MA (#) HYP (#) AHI (#/h) RERA (#) RDI (#/h) Desat (#)  Supine 355.5 100.00 88.5 267.0 0 0 0 0 0.0 0 0.00 0  Non-Supine 0.00 0.00 0.00 0.00 0.00 0.00 0.00 0.00 0.00 0 0.00 0.00     Snoring: Total number of snoring episodes  0  Total time with snoring    min (   % of sleep)   Oximetry Distribution:             WK REM NREM TOTAL  Average (%)   98 98 98 98  < 90% 0.0 0.0 0.0 0.0  < 80% 0.0 0.0 0.0 0.0  < 70% 0.0 0.0 0.0 0.0  # of Desaturations* 1 0 0 1  Desat Index (#/hour) 3.1 0.0 0.0 0.2  Desat Max (%) 7 0 0 7  Desat Max Dur (sec) 84.0 0.0 0.0 84.0  Approx Min O2 during sleep 96  Approx min O2 during a respiratory event     Was Oxygen added (Y/N) and final rate No:   0 LPM  *Desaturations based on 3% or greater drop from baseline.   Cheyne Stokes Breathing: None Present    Heart Rate Summary:  Average Heart Rate During Sleep 27.1 bpm      Highest Heart Rate During Sleep (95th %) 49.0 bpm      Highest Heart Rate During Sleep 193 bpm (artifact)  Highest Heart Rate During Recording (TIB) 194 bpm (artifact)   Heart Rate Observations: Event Type # Events   Bradycardia 0 Lowest HR Scored: N/A  Sinus Tachycardia During Sleep 0 Highest HR Scored: N/A  Narrow Complex Tachycardia 0 Highest HR Scored: N/A  Wide Complex Tachycardia 0 Highest HR Scored: N/A  Asystole 0 Longest Pause: N/A  Atrial Fibrillation 0 Duration Longest  Event: N/A  Other Arrythmias  No Type:   Periodic Limb Movement Data: (Primary legs unless otherwise noted) Total # Limb Movement 42 Limb Movement Index 7.1  Total # PLMS    PLMS Index     Total # PLMS Arousals    PLMS Arousal Index     Percentage Sleep Time with PLMS   min (   % sleep)  Mean Duration limb movements (secs)       IPAP Level (cmH2O) EPAP Level (cmH2O) Total Duration (min) Sleep Duration (min) Sleep (%) REM (%) CA  #) OA # MA # HYP #) AHI (#/hr) RERAs # RERAs (#/hr) RDI (#/hr)  5 5 121.3 117.3 96.7 14.8 0 0 0 0 0.0 0 0.0 0.0  6 6 152.3 147.3 96.7 27.2 0 0 0 0 0.0 0 0.0 0.0  7 7 93.8 90.3 96.3 30.7 0 0 0 0 0.0 0 0.0 0.0

## 2021-07-16 ENCOUNTER — Telehealth: Payer: Self-pay

## 2021-07-16 ENCOUNTER — Other Ambulatory Visit: Payer: Self-pay

## 2021-07-16 ENCOUNTER — Encounter: Payer: Self-pay | Admitting: Physician Assistant

## 2021-07-16 ENCOUNTER — Ambulatory Visit: Payer: Medicaid Other | Admitting: Physician Assistant

## 2021-07-16 VITALS — BP 123/83 | HR 73 | Temp 98.0°F | Resp 16 | Ht 60.0 in | Wt 117.8 lb

## 2021-07-16 DIAGNOSIS — G4733 Obstructive sleep apnea (adult) (pediatric): Secondary | ICD-10-CM | POA: Diagnosis not present

## 2021-07-16 DIAGNOSIS — J8410 Pulmonary fibrosis, unspecified: Secondary | ICD-10-CM

## 2021-07-16 DIAGNOSIS — I517 Cardiomegaly: Secondary | ICD-10-CM | POA: Diagnosis not present

## 2021-07-16 DIAGNOSIS — I5189 Other ill-defined heart diseases: Secondary | ICD-10-CM | POA: Diagnosis not present

## 2021-07-16 NOTE — Progress Notes (Signed)
[facility], [place] [address]  Internal MEDICINE  Office Visit Note  Patient Name: Julia Watson  [id number]  [id number]  Date of Service: 07/22/2021  Chief Complaint  Patient presents with   Follow-up   Nose Problem    Sometimes bridge of nose hurts, feels like pressure    HPI Pt is here for follow up to discuss SS results which did show she has overall mild OSA with an AHI of 7.6/hr. -Titration recommends she start on cpap of 7cm H2O which will be ordered today. -Pt reports she slept fine in the lab with the mask on -She does mention sometime she feels pressure on bridge of nose and thinks it is from her glasses and it is where the nose pieces hit. -Also discussed CXR did not show any signs of TB, however it did show an incidental finding of small calcified granuloma in right upper lobe. Denies any problems with breathing and states this is new since having covid as she had a CXR the year prior that did not show this. Will keep monitoring  Current Medication: Outpatient Encounter Medications as of 07/16/2021  Medication Sig   rosuvastatin (CRESTOR) 5 MG tablet Take 1 tablet (5 mg total) by mouth daily.   Sod Picosulfate-Mag Ox-Cit Acd (CLENPIQ) 10-3.5-12 MG-GM -GM/160ML SOLN Take 1 kit by mouth as directed. At 5 PM evening before procedure, drink 1 bottle of Clenpiq, hydrate, drink (5) 8 oz of water. Then do the same thing 5 hours prior to your procedure.   No facility-administered encounter medications on file as of 07/16/2021.    Surgical History: Past Surgical History:  Procedure Laterality Date   CESAREAN SECTION      Medical History: History reviewed. No pertinent past medical history.  Family History: Family History  Problem Relation Age of Onset   Arthritis Mother    Breast cancer Neg Hx     Social History   Socioeconomic History   Marital status: Married    Spouse name: Not on file   Number of children: Not on file    Years of education: Not on file   Highest education level: Not on file  Occupational History   Not on file  Tobacco Use   Smoking status: Never   Smokeless tobacco: Never  Substance and Sexual Activity   Alcohol use: Never   Drug use: Never   Sexual activity: Not on file  Other Topics Concern   Not on file  Social History Narrative   Not on file   Social Determinants of Health   Financial Resource Strain: Not on file  Food Insecurity: Not on file  Transportation Needs: Not on file  Physical Activity: Not on file  Stress: Not on file  Social Connections: Not on file  Intimate Partner Violence: Not on file      Review of Systems  Constitutional:  Positive for fatigue. Negative for chills and unexpected weight change.  HENT:  Negative for congestion, postnasal drip, rhinorrhea, sneezing and sore throat.   Eyes:  Negative for redness.  Respiratory:  Negative for cough, chest tightness and shortness of breath.   Cardiovascular:  Negative for chest pain and palpitations.  Gastrointestinal:  Negative for abdominal pain, constipation, diarrhea, nausea and vomiting.  Genitourinary:  Negative for dysuria and frequency.  Musculoskeletal:  Negative for arthralgias, back pain, joint swelling and neck pain.  Skin:  Negative for rash.  Neurological: Negative.  Negative for tremors and numbness.  Hematological:  Negative for adenopathy. Does not bruise/bleed easily.  Psychiatric/Behavioral:  Positive for sleep disturbance. Negative for behavioral problems (Depression) and suicidal ideas. The patient is not nervous/anxious.    Vital Signs: BP 123/83    Pulse 73    Temp 98 F (36.7 C)    Resp 16    Ht 5' (1.524 m)    Wt 117 lb 12.8 oz (53.4 kg)    SpO2 98%    BMI 23.01 kg/m    Physical Exam Vitals and nursing note reviewed.  Constitutional:      General: She is not in acute distress.    Appearance: She is well-developed and normal weight. She is not diaphoretic.  HENT:     Head:  Normocephalic and atraumatic.     Mouth/Throat:     Pharynx: No oropharyngeal exudate.  Eyes:     Pupils: Pupils are equal, round, and reactive to light.  Neck:     Thyroid: No thyromegaly.     Vascular: No JVD.     Trachea: No tracheal deviation.  Cardiovascular:     Rate and Rhythm: Normal rate and regular rhythm.     Heart sounds: Normal heart sounds. No murmur heard.   No friction rub. No gallop.  Pulmonary:     Effort: Pulmonary effort is normal. No respiratory distress.     Breath sounds: No wheezing or rales.  Chest:     Chest wall: No tenderness.  Abdominal:     General: Bowel sounds are normal.     Palpations: Abdomen is soft.  Musculoskeletal:        General: Normal range of motion.     Cervical back: Normal range of motion and neck supple.  Lymphadenopathy:     Cervical: No cervical adenopathy.  Skin:    General: Skin is warm and dry.  Neurological:     Mental Status: She is alert and oriented to person, place, and time.     Cranial Nerves: No cranial nerve deficit.  Psychiatric:        Behavior: Behavior normal.        Thought Content: Thought content normal.        Judgment: Judgment normal.       Assessment/Plan: 1. OSA (obstructive sleep apnea) Will order CPAP at 7cm H2O - For home use only DME continuous positive airway pressure (CPAP)  2. Right atrial dilation Will start on cpap and continue to monitor  3. Diastolic dysfunction Will start on cpap and continue to monitor  4. Calcified granuloma of lung ([place]) Incidental finding on CXR and is new since having had covid in the last year. Denies any symptoms currently, will continue to monitor   General Counseling: [name] verbalizes understanding of the findings of todays visit and agrees with plan of treatment. I have discussed any further diagnostic evaluation that may be needed or ordered today. We also reviewed her medications today. she has been encouraged to call the office with any questions  or concerns that should arise related to todays visit.    Orders Placed This Encounter  Procedures   For home use only DME continuous positive airway pressure (CPAP)    No orders of the defined types were placed in this encounter.   This patient was seen by Drema Dallas, PA-C in collaboration with Dr. Clayborn Bigness as a part of collaborative care agreement.   Total time spent:30 Minutes Time spent includes review of chart, medications, test results, and follow up plan with the  patient.      Dr Lavera Guise Internal medicine

## 2021-07-16 NOTE — Telephone Encounter (Signed)
error 

## 2021-07-16 NOTE — Telephone Encounter (Signed)
Community message sent to Maralyn Sago @ [facility] for new order for CPAP machine

## 2021-07-17 NOTE — Telephone Encounter (Signed)
Send message by desha to [facility] waiting for respond

## 2021-07-22 NOTE — Patient Instructions (Signed)
Living With Sleep Apnea Sleep apnea is a condition in which breathing pauses or becomes shallow during sleep. Sleep apnea is most commonly caused by a collapsed or blocked airway. People with sleep apnea usually snore loudly. They may have times when they gasp and stop breathing for 10 seconds or more during sleep. This may happen many times during the night. The breaks in breathing also interrupt the deep sleep that you need to feel rested. Even if you do not completely wake up from the gaps in breathing, your sleep may not be restful and you feel tired during the day. You may also have a headache in the morning and low energy during the day, and you may feel anxious or depressed. How can sleep apnea affect me? Sleep apnea increases your chances of extreme tiredness during the day (daytime fatigue). It can also increase your risk for health conditions, such as: Heart attack. Stroke. Obesity. Type 2 diabetes. Heart failure. Irregular heartbeat. High blood pressure. If you have daytime fatigue as a result of sleep apnea, you may be more likely to: Perform poorly at school or work. Fall asleep while driving. Have difficulty with attention. Develop depression or anxiety. Have sexual dysfunction. What actions can I take to manage sleep apnea? Sleep apnea treatment  If you were given a device to open your airway while you sleep, use it only as told by your health care provider. You may be given: An oral appliance. This is a custom-made mouthpiece that shifts your lower jaw forward. A continuous positive airway pressure (CPAP) device. This device blows air through a mask when you breathe out (exhale). A nasal expiratory positive airway pressure (EPAP) device. This device has valves that you put into each nostril. A bi-level positive airway pressure (BIPAP) device. This device blows air through a mask when you breathe in (inhale) and breathe out (exhale). You may need surgery if other treatments  do not work for you. Sleep habits Go to sleep and wake up at the same time every day. This helps set your internal clock (circadian rhythm) for sleeping. If you stay up later than usual, such as on weekends, try to get up in the morning within 2 hours of your normal wake time. Try to get at least 7-9 hours of sleep each night. Stop using a computer, tablet, and mobile phone a few hours before bedtime. Do not take long naps during the day. If you nap, limit it to 30 minutes. Have a relaxing bedtime routine. Reading or listening to music may relax you and help you sleep. Use your bedroom only for sleep. Keep your television and computer out of your bedroom. Keep your bedroom cool, dark, and quiet. Use a supportive mattress and pillows. Follow your health care provider's instructions for other changes to sleep habits. Nutrition Do not eat heavy meals in the evening. Do not have caffeine in the later part of the day. The effects of caffeine can last for more than 5 hours. Follow your health care provider's or dietitian's instructions for any diet changes. Lifestyle   Do not drink alcohol before bedtime. Alcohol can cause you to fall asleep at first, but then it can cause you to wake up in the middle of the night and have trouble getting back to sleep. Do not use any products that contain nicotine or tobacco. These products include cigarettes, chewing tobacco, and vaping devices, such as e-cigarettes. If you need help quitting, ask your health care provider. Medicines Take over-the-counter and   prescription medicines only as told by your health care provider. Do not use over-the-counter sleep medicine. You can become dependent on this medicine, and it can make sleep apnea worse. Do not use medicines, such as sedatives and narcotics, unless told by your health care provider. Activity Exercise on most days, but avoid exercising in the evening. Exercising near bedtime can interfere with  sleeping. If possible, spend time outside every day. Natural light helps regulate your circadian rhythm. General information Lose weight if you need to, and maintain a healthy weight. Keep all follow-up visits. This is important. If you are having surgery, make sure to tell your health care provider that you have sleep apnea. You may need to bring your device with you. Where to find more information Learn more about sleep apnea and daytime fatigue from: American Sleep Association: sleepassociation.org National Sleep Foundation: sleepfoundation.org National Heart, Lung, and Blood Institute: nhlbi.nih.gov Summary Sleep apnea is a condition in which breathing pauses or becomes shallow during sleep. Sleep apnea can cause daytime fatigue and other serious health conditions. You may need to wear a device while sleeping to help keep your airway open. If you are having surgery, make sure to tell your health care provider that you have sleep apnea. You may need to bring your device with you. Making changes to sleep habits, diet, lifestyle, and activity can help you manage sleep apnea. This information is not intended to replace advice given to you by your health care provider. Make sure you discuss any questions you have with your health care provider. Document Revised: 01/21/2021 Document Reviewed: 05/23/2020 Elsevier Patient Education  2022 Elsevier Inc.  

## 2021-08-20 ENCOUNTER — Ambulatory Visit: Payer: Medicaid Other | Admitting: Physician Assistant

## 2021-08-20 ENCOUNTER — Encounter: Payer: Self-pay | Admitting: Physician Assistant

## 2021-08-20 ENCOUNTER — Other Ambulatory Visit: Payer: Self-pay

## 2021-08-20 VITALS — BP 117/79 | HR 79 | Temp 97.8°F | Resp 16 | Ht 60.0 in | Wt 121.0 lb

## 2021-08-20 DIAGNOSIS — J0100 Acute maxillary sinusitis, unspecified: Secondary | ICD-10-CM

## 2021-08-20 MED ORDER — AMOXICILLIN-POT CLAVULANATE 875-125 MG PO TABS: 1.0000 | ORAL_TABLET | Freq: Two times a day (BID) | ORAL | 0 refills | Status: DC

## 2021-08-20 NOTE — Progress Notes (Signed)
[facility] [address], [place] [address]  Internal MEDICINE  Office Visit Note  Patient Name: Julia Watson  [id number]  [id number]  Date of Service: 08/20/2021  Chief Complaint  Patient presents with   Sinusitis    Started exactly 2 weeks ago     HPI Pt is here for a sick visit. -For the past 2 weeks has had eyes itching, congestion and sore throat. Sneezing and runny nose -Feels tired, right ear itchy as well -covid test negative  -has tried zyrtec, benadryl, and sudafed -Denies wheezing or SOB, but has had some cough  Current Medication:  Outpatient Encounter Medications as of 08/20/2021  Medication Sig   amoxicillin-clavulanate (AUGMENTIN) 875-125 MG tablet Take 1 tablet by mouth 2 (two) times daily.   rosuvastatin (CRESTOR) 5 MG tablet Take 1 tablet (5 mg total) by mouth daily.   Sod Picosulfate-Mag Ox-Cit Acd (CLENPIQ) 10-3.5-12 MG-GM -GM/160ML SOLN Take 1 kit by mouth as directed. At 5 PM evening before procedure, drink 1 bottle of Clenpiq, hydrate, drink (5) 8 oz of water. Then do the same thing 5 hours prior to your procedure. (Patient not taking: Reported on 08/20/2021)   No facility-administered encounter medications on file as of 08/20/2021.      Medical History: History reviewed. No pertinent past medical history.   Vital Signs: BP 117/79    Pulse 79    Temp 97.8 F (36.6 C)    Resp 16    Ht 5' (1.524 m)    Wt 121 lb (54.9 kg)    SpO2 98%    BMI 23.63 kg/m    Review of Systems  Constitutional:  Negative for fatigue and fever.  HENT:  Positive for congestion, postnasal drip, sinus pressure, sneezing and sore throat. Negative for mouth sores.   Eyes:  Positive for itching.  Respiratory:  Positive for cough. Negative for shortness of breath and wheezing.   Cardiovascular:  Negative for chest pain.  Genitourinary:  Negative for flank pain.  Psychiatric/Behavioral: Negative.     Physical Exam Vitals and nursing note reviewed.   Constitutional:      General: She is not in acute distress.    Appearance: She is well-developed. She is not diaphoretic.  HENT:     Head: Normocephalic and atraumatic.     Right Ear: Tympanic membrane normal.     Left Ear: Tympanic membrane normal.     Nose: Rhinorrhea present.     Mouth/Throat:     Pharynx: Posterior oropharyngeal erythema present. No oropharyngeal exudate.  Eyes:     General:        Right eye: No discharge.        Left eye: No discharge.     Pupils: Pupils are equal, round, and reactive to light.  Neck:     Thyroid: No thyromegaly.     Vascular: No JVD.     Trachea: No tracheal deviation.  Cardiovascular:     Rate and Rhythm: Normal rate and regular rhythm.     Heart sounds: Normal heart sounds. No murmur heard.   No friction rub. No gallop.  Pulmonary:     Effort: Pulmonary effort is normal. No respiratory distress.     Breath sounds: No wheezing or rales.  Chest:     Chest wall: No tenderness.  Abdominal:     General: Bowel sounds are normal.     Palpations: Abdomen is soft.  Musculoskeletal:        General: Normal range  of motion.     Cervical back: Normal range of motion and neck supple.  Lymphadenopathy:     Cervical: No cervical adenopathy.  Skin:    General: Skin is warm and dry.  Neurological:     Mental Status: She is alert and oriented to person, place, and time.     Cranial Nerves: No cranial nerve deficit.  Psychiatric:        Behavior: Behavior normal.        Thought Content: Thought content normal.        Judgment: Judgment normal.      Assessment/Plan: 1. Acute non-recurrent maxillary sinusitis Will start on augmentin BID with food. Continue zyrtec and may add mucinex and flonase. Also may try eye drops to help itching--systane to flush eyes, but can also try allergy drops such as [place] - amoxicillin-clavulanate (AUGMENTIN) 875-125 MG tablet; Take 1 tablet by mouth 2 (two) times daily.  Dispense: 20 tablet; Refill:  0   General Counseling: [place] verbalizes understanding of the findings of todays visit and agrees with plan of treatment. I have discussed any further diagnostic evaluation that may be needed or ordered today. We also reviewed her medications today. she has been encouraged to call the office with any questions or concerns that should arise related to todays visit.    Counseling:    No orders of the defined types were placed in this encounter.   Meds ordered this encounter  Medications   amoxicillin-clavulanate (AUGMENTIN) 875-125 MG tablet    Sig: Take 1 tablet by mouth 2 (two) times daily.    Dispense:  20 tablet    Refill:  0    Time spent:25 Minutes

## 2021-09-04 ENCOUNTER — Telehealth: Payer: Self-pay

## 2021-09-04 NOTE — Telephone Encounter (Signed)
Verbal order for OSA supplies signed by provider and placed in [facility] folder. 

## 2021-09-28 ENCOUNTER — Telehealth: Payer: Self-pay

## 2021-09-28 NOTE — Telephone Encounter (Signed)
Verbal order for OSA supplies signed by provider and placed in [facility] folder. 

## 2021-09-30 ENCOUNTER — Ambulatory Visit: Payer: Medicaid Other

## 2022-02-08 ENCOUNTER — Encounter: Payer: Medicaid Other | Admitting: Physician Assistant

## 2022-02-08 ENCOUNTER — Ambulatory Visit: Payer: Medicaid Other | Admitting: Physician Assistant

## 2022-02-25 ENCOUNTER — Other Ambulatory Visit: Payer: Self-pay

## 2022-02-25 ENCOUNTER — Encounter: Payer: Self-pay | Admitting: Physician Assistant

## 2022-02-25 ENCOUNTER — Telehealth: Payer: Self-pay

## 2022-02-25 ENCOUNTER — Ambulatory Visit (INDEPENDENT_AMBULATORY_CARE_PROVIDER_SITE_OTHER): Payer: Medicaid Other | Admitting: Physician Assistant

## 2022-02-25 VITALS — BP 128/82 | HR 67 | Temp 97.8°F | Resp 16 | Ht 60.0 in | Wt 120.0 lb

## 2022-02-25 DIAGNOSIS — Z1211 Encounter for screening for malignant neoplasm of colon: Secondary | ICD-10-CM

## 2022-02-25 DIAGNOSIS — Z1231 Encounter for screening mammogram for malignant neoplasm of breast: Secondary | ICD-10-CM | POA: Diagnosis not present

## 2022-02-25 DIAGNOSIS — E7800 Pure hypercholesterolemia, unspecified: Secondary | ICD-10-CM | POA: Diagnosis not present

## 2022-02-25 DIAGNOSIS — R946 Abnormal results of thyroid function studies: Secondary | ICD-10-CM

## 2022-02-25 DIAGNOSIS — R5383 Other fatigue: Secondary | ICD-10-CM

## 2022-02-25 DIAGNOSIS — E559 Vitamin D deficiency, unspecified: Secondary | ICD-10-CM

## 2022-02-25 DIAGNOSIS — R300 Dysuria: Secondary | ICD-10-CM

## 2022-02-25 DIAGNOSIS — G4733 Obstructive sleep apnea (adult) (pediatric): Secondary | ICD-10-CM

## 2022-02-25 DIAGNOSIS — Z0001 Encounter for general adult medical examination with abnormal findings: Secondary | ICD-10-CM

## 2022-02-25 DIAGNOSIS — Z01419 Encounter for gynecological examination (general) (routine) without abnormal findings: Secondary | ICD-10-CM

## 2022-02-25 DIAGNOSIS — Z1212 Encounter for screening for malignant neoplasm of rectum: Secondary | ICD-10-CM

## 2022-02-25 MED ORDER — ROSUVASTATIN CALCIUM 5 MG PO TABS: 5.0000 mg | ORAL_TABLET | Freq: Every day | ORAL | 3 refills | Status: DC

## 2022-02-25 MED ORDER — TETANUS-DIPHTH-ACELL PERTUSSIS 5-2.5-18.5 LF-MCG/0.5 IM SUSY: 0.5000 mL | PREFILLED_SYRINGE | Freq: Once | INTRAMUSCULAR | 0 refills | Status: AC

## 2022-02-25 NOTE — Progress Notes (Signed)
[facility] [place], [place] [address]  Internal MEDICINE  Office Visit Note  Patient Name: Julia Watson  [id number]  [id number]  Date of Service: 03/03/2022  Chief Complaint  Patient presents with   Annual Exam   Hyperlipidemia   Quality Metric Gaps    Colonoscopy and TDAP     HPI Pt is here for routine health maintenance examination -No new complaints today -Started on CPAP but couldn't tolerate and then turned it in. Has been sleeping ok. Did discuss sometimes it can take awhile to get adjusted to cpap or require trying alternative mask style and to let us know if interested in trying again. -Due for colonoscopy and will send referral, also due for mammogram and tdap -Due for routine fasting labs -Continues to tolerate crestor -BP stable in office  Current Medication: Outpatient Encounter Medications as of 02/25/2022  Medication Sig   [DISCONTINUED] rosuvastatin (CRESTOR) 5 MG tablet Take 1 tablet (5 mg total) by mouth daily.   [DISCONTINUED] Tdap (BOOSTRIX) 5-2.5-18.5 LF-MCG/0.5 injection Inject 0.5 mLs into the muscle once.   rosuvastatin (CRESTOR) 5 MG tablet Take 1 tablet (5 mg total) by mouth daily.   Sod Picosulfate-Mag Ox-Cit Acd (CLENPIQ) 10-3.5-12 MG-GM -GM/160ML SOLN Take 1 kit by mouth as directed. At 5 PM evening before procedure, drink 1 bottle of Clenpiq, hydrate, drink (5) 8 oz of water. Then do the same thing 5 hours prior to your procedure. (Patient not taking: Reported on 08/20/2021)   [EXPIRED] Tdap (BOOSTRIX) 5-2.5-18.5 LF-MCG/0.5 injection Inject 0.5 mLs into the muscle once for 1 dose.   [DISCONTINUED] amoxicillin-clavulanate (AUGMENTIN) 875-125 MG tablet Take 1 tablet by mouth 2 (two) times daily.   No facility-administered encounter medications on file as of 02/25/2022.    Surgical History: Past Surgical History:  Procedure Laterality Date   CESAREAN SECTION      Medical History: History reviewed. No pertinent past  medical history.  Family History: Family History  Problem Relation Age of Onset   Arthritis Mother    Breast cancer Neg Hx       Review of Systems  Constitutional:  Negative for chills, fatigue and unexpected weight change.  HENT:  Negative for congestion, postnasal drip, rhinorrhea, sneezing and sore throat.   Eyes:  Negative for redness.  Respiratory:  Negative for cough, chest tightness and shortness of breath.   Cardiovascular:  Negative for chest pain and palpitations.  Gastrointestinal:  Negative for abdominal pain, constipation, diarrhea, nausea and vomiting.  Genitourinary:  Negative for dysuria and frequency.  Musculoskeletal:  Negative for arthralgias, back pain, joint swelling and neck pain.  Skin:  Negative for rash.  Neurological: Negative.  Negative for tremors and numbness.  Hematological:  Negative for adenopathy. Does not bruise/bleed easily.  Psychiatric/Behavioral:  Negative for behavioral problems (Depression), sleep disturbance and suicidal ideas. The patient is not nervous/anxious.      Vital Signs: BP 128/82   Pulse 67   Temp 97.8 F (36.6 C)   Resp 16   Ht 5' (1.524 m)   Wt 120 lb (54.4 kg)   SpO2 97%   BMI 23.44 kg/m    Physical Exam Vitals and nursing note reviewed.  Constitutional:      General: She is not in acute distress.    Appearance: She is well-developed and normal weight. She is not diaphoretic.  HENT:     Head: Normocephalic and atraumatic.     Mouth/Throat:     Pharynx: No oropharyngeal exudate.  Eyes:     Pupils: Pupils are equal, round, and reactive to light.  Neck:     Thyroid: No thyromegaly.     Vascular: No JVD.     Trachea: No tracheal deviation.  Cardiovascular:     Rate and Rhythm: Normal rate and regular rhythm.     Heart sounds: Normal heart sounds. No murmur heard.    No friction rub. No gallop.  Pulmonary:     Effort: Pulmonary effort is normal. No respiratory distress.     Breath sounds: No wheezing or  rales.  Chest:     Chest wall: No tenderness.  Breasts:    Right: Normal. No mass.     Left: Normal. No mass.  Abdominal:     General: Bowel sounds are normal.     Palpations: Abdomen is soft.  Musculoskeletal:        General: Normal range of motion.     Cervical back: Normal range of motion and neck supple.  Lymphadenopathy:     Cervical: No cervical adenopathy.  Skin:    General: Skin is warm and dry.  Neurological:     Mental Status: She is alert and oriented to person, place, and time.     Cranial Nerves: No cranial nerve deficit.  Psychiatric:        Behavior: Behavior normal.        Thought Content: Thought content normal.        Judgment: Judgment normal.      LABS: Recent Results (from the past 2160 hour(s))  UA/M w/rflx Culture, Routine     Status: Abnormal   Collection Time: 02/25/22 11:47 AM   Specimen: Urine   Urine  Result Value Ref Range   Specific Gravity, UA 1.013 1.005 - 1.030   pH, UA 6.0 5.0 - 7.5   Color, UA Yellow Yellow   Appearance Ur Clear Clear   Leukocytes,UA Trace (A) Negative   Protein,UA Negative Negative/Trace   Glucose, UA Negative Negative   Ketones, UA Negative Negative   RBC, UA 2+ (A) Negative   Bilirubin, UA Negative Negative   Urobilinogen, Ur 0.2 0.2 - 1.0 mg/dL   Nitrite, UA Negative Negative   Microscopic Examination See below:     Comment: Microscopic was indicated and was performed.   Urinalysis Reflex Comment     Comment: This specimen has reflexed to a Urine Culture.  Microscopic Examination     Status: Abnormal   Collection Time: 02/25/22 11:47 AM   Urine  Result Value Ref Range   WBC, UA 11-30 (A) 0 - 5 /hpf   RBC, Urine 0-2 0 - 2 /hpf   Epithelial Cells (non renal) 0-10 0 - 10 /hpf   Casts None seen None seen /lpf   Bacteria, UA Few None seen/Few  Urine Culture, Reflex     Status: None   Collection Time: 02/25/22 11:47 AM   Urine  Result Value Ref Range   Urine Culture, Routine Final report    Organism ID,  Bacteria Comment     Comment: Mixed urogenital flora Less than 10,000 colonies/mL        Assessment/Plan: 1. Encounter for general adult medical examination with abnormal findings Cpe performed, routine labs ordered, due for colonoscopy and mammogram  2. Pure hypercholesterolemia Will continue crestor and update labs - Lipid Panel With LDL/HDL Ratio - rosuvastatin (CRESTOR) 5 MG tablet; Take 1 tablet (5 mg total) by mouth daily.  Dispense: 90 tablet; Refill: 3  3. OSA (  obstructive sleep apnea) Overall mild Osa in supine, but unable to tolerate cpap. Contact office if willing to retry  4. Screening for colorectal cancer - Ambulatory referral to Gastroenterology  5. Visit for screening mammogram - MM 3D SCREEN BREAST BILATERAL; Future  6. Visit for gynecologic examination Breast exam performed  7. Vitamin D deficiency - VITAMIN D 25 Hydroxy (Vit-D Deficiency, Fractures)  8. Abnormal thyroid exam - TSH + free T4  9. Other fatigue - Comprehensive metabolic panel - CBC w/Diff/Platelet - TSH + free T4 - Lipid Panel With LDL/HDL Ratio - VITAMIN D 25 Hydroxy (Vit-D Deficiency, Fractures)  10. Dysuria - UA/M w/rflx Culture, Routine - Microscopic Examination - Urine Culture, Reflex   General Counseling: [place] verbalizes understanding of the findings of todays visit and agrees with plan of treatment. I have discussed any further diagnostic evaluation that may be needed or ordered today. We also reviewed her medications today. she has been encouraged to call the office with any questions or concerns that should arise related to todays visit.    Counseling:    Orders Placed This Encounter  Procedures   Microscopic Examination   Urine Culture, Reflex   MM 3D SCREEN BREAST BILATERAL   UA/M w/rflx Culture, Routine   Comprehensive metabolic panel   CBC w/Diff/Platelet   TSH + free T4   Lipid Panel With LDL/HDL Ratio   VITAMIN D 25 Hydroxy (Vit-D Deficiency,  Fractures)   Ambulatory referral to Gastroenterology    Meds ordered this encounter  Medications   Tdap (BOOSTRIX) 5-2.5-18.5 LF-MCG/0.5 injection    Sig: Inject 0.5 mLs into the muscle once for 1 dose.    Dispense:  0.5 mL    Refill:  0   rosuvastatin (CRESTOR) 5 MG tablet    Sig: Take 1 tablet (5 mg total) by mouth daily.    Dispense:  90 tablet    Refill:  3    This patient was seen by Drema Dallas, PA-C in collaboration with Dr. Clayborn Bigness as a part of collaborative care agreement.  Total time spent:35 Minutes  Time spent includes review of chart, medications, test results, and follow up plan with the patient.     Lavera Guise, MD  Internal Medicine

## 2022-02-25 NOTE — Telephone Encounter (Signed)
Gastroenterology Pre-Procedure Review  Request Date: 03/18/22 Requesting Physician: Dr. Vicente Males  PATIENT REVIEW QUESTIONS: The patient responded to the following health history questions as indicated:    1. Are you having any GI issues? no 2. Do you have a personal history of Polyps? no 3. Do you have a family history of Colon Cancer or Polyps? no 4. Diabetes Mellitus? no 5. Joint replacements in the past 12 months?no 6. Major health problems in the past 3 months?no 7. Any artificial heart valves, MVP, or defibrillator?no    MEDICATIONS & ALLERGIES:    Patient reports the following regarding taking any anticoagulation/antiplatelet therapy:   Plavix, Coumadin, Eliquis, Xarelto, Lovenox, Pradaxa, Brilinta, or Effient? no Aspirin? no  Patient confirms/reports the following medications:  Current Outpatient Medications  Medication Sig Dispense Refill   rosuvastatin (CRESTOR) 5 MG tablet Take 1 tablet (5 mg total) by mouth daily. 90 tablet 3   Sod Picosulfate-Mag Ox-Cit Acd (CLENPIQ) 10-3.5-12 MG-GM -GM/160ML SOLN Take 1 kit by mouth as directed. At 5 PM evening before procedure, drink 1 bottle of Clenpiq, hydrate, drink (5) 8 oz of water. Then do the same thing 5 hours prior to your procedure. (Patient not taking: Reported on 08/20/2021) 320 mL 0   Tdap (BOOSTRIX) 5-2.5-18.5 LF-MCG/0.5 injection Inject 0.5 mLs into the muscle once for 1 dose. 0.5 mL 0   No current facility-administered medications for this visit.    Patient confirms/reports the following allergies:  Allergies  Allergen Reactions   Pollinex-T [Modified Tree Tyrosine Adsorbate]     No orders of the defined types were placed in this encounter.   AUTHORIZATION INFORMATION Primary Insurance: 1D#: Group #:  Secondary Insurance: 1D#: Group #:  SCHEDULE INFORMATION: Date: 03/18/22 Time: Location: [facility]

## 2022-02-27 LAB — URINE CULTURE, REFLEX

## 2022-02-27 LAB — UA/M W/RFLX CULTURE, ROUTINE
Bilirubin, UA: NEGATIVE
Glucose, UA: NEGATIVE
Ketones, UA: NEGATIVE
Nitrite, UA: NEGATIVE
Protein,UA: NEGATIVE
Specific Gravity, UA: 1.013 (ref 1.005–1.030)
Urobilinogen, Ur: 0.2 mg/dL (ref 0.2–1.0)
pH, UA: 6 (ref 5.0–7.5)

## 2022-02-27 LAB — MICROSCOPIC EXAMINATION: Casts: NONE SEEN /lpf

## 2022-03-17 ENCOUNTER — Encounter: Payer: Self-pay | Admitting: Gastroenterology

## 2022-03-18 ENCOUNTER — Ambulatory Visit: Payer: Medicaid Other | Admitting: Anesthesiology

## 2022-03-18 ENCOUNTER — Encounter
Admission: RE | Disposition: A | Discharge status: Home / Self Care | Payer: Self-pay | Source: Ambulatory Visit | Attending: Gastroenterology

## 2022-03-18 ENCOUNTER — Ambulatory Visit
Admission: RE | Admit: 2022-03-18 | Discharge: 2022-03-18 | Disposition: A | Discharge status: Home / Self Care | Payer: Medicaid Other | Source: Ambulatory Visit | Attending: Gastroenterology | Admitting: Gastroenterology

## 2022-03-18 ENCOUNTER — Other Ambulatory Visit: Payer: Self-pay

## 2022-03-18 ENCOUNTER — Encounter: Payer: Self-pay | Admitting: Gastroenterology

## 2022-03-18 DIAGNOSIS — G4730 Sleep apnea, unspecified: Secondary | ICD-10-CM | POA: Insufficient documentation

## 2022-03-18 DIAGNOSIS — I10 Essential (primary) hypertension: Secondary | ICD-10-CM | POA: Insufficient documentation

## 2022-03-18 DIAGNOSIS — Z1211 Encounter for screening for malignant neoplasm of colon: Secondary | ICD-10-CM

## 2022-03-18 HISTORY — DX: Hyperlipidemia, unspecified: E78.5

## 2022-03-18 HISTORY — PX: COLONOSCOPY WITH PROPOFOL: SHX5780

## 2022-03-18 LAB — POCT PREGNANCY, URINE: Preg Test, Ur: NEGATIVE

## 2022-03-18 SURGERY — COLONOSCOPY WITH PROPOFOL
Anesthesia: General

## 2022-03-18 MED ORDER — SODIUM CHLORIDE 0.9 % IV SOLN: INTRAVENOUS | Status: DC

## 2022-03-18 MED ORDER — LIDOCAINE HCL (CARDIAC) PF 100 MG/5ML IV SOSY
PREFILLED_SYRINGE | INTRAVENOUS | Status: DC | PRN
  Administered 2022-03-18: 100 mg via INTRAVENOUS

## 2022-03-18 MED ORDER — PROPOFOL 10 MG/ML IV BOLUS
INTRAVENOUS | Status: DC | PRN
  Administered 2022-03-18: 70 mg via INTRAVENOUS

## 2022-03-18 MED ORDER — PROPOFOL 500 MG/50ML IV EMUL
INTRAVENOUS | Status: DC | PRN
  Administered 2022-03-18: 155 ug/kg/min via INTRAVENOUS

## 2022-03-18 NOTE — Transfer of Care (Signed)
Immediate Anesthesia Transfer of Care Note  Patient: [place]  Procedure(s) Performed: COLONOSCOPY WITH PROPOFOL  Patient Location: Endoscopy Unit  Anesthesia Type:General  Level of Consciousness: awake, drowsy and patient cooperative  Airway & Oxygen Therapy: Patient Spontanous Breathing and Patient connected to face mask oxygen  Post-op Assessment: Report given to RN and Post -op Vital signs reviewed and stable  Post vital signs: Reviewed and stable  Last Vitals:  Vitals Value Taken Time  BP 113/75 03/18/22 0934  Temp    Pulse 84 03/18/22 0934  Resp 12 03/18/22 0939  SpO2 100 % 03/18/22 0934  Vitals shown include unvalidated device data.  Last Pain:  Vitals:   03/18/22 0839  TempSrc: Temporal  PainSc: 0-No pain         Complications: No notable events documented.

## 2022-03-18 NOTE — Anesthesia Preprocedure Evaluation (Signed)
Anesthesia Evaluation  Patient identified by MRN, date of birth, ID band Patient awake    Reviewed: Allergy & Precautions, NPO status , Patient's Chart, lab work & pertinent test results  Airway Mallampati: II  TM Distance: >3 FB Neck ROM: full    Dental  (+) Caps,    Pulmonary neg pulmonary ROS, sleep apnea ,    Pulmonary exam normal breath sounds clear to auscultation       Cardiovascular Exercise Tolerance: Good hypertension, negative cardio ROS Normal cardiovascular exam Rhythm:Regular     Neuro/Psych negative neurological ROS  negative psych ROS   GI/Hepatic negative GI ROS, Neg liver ROS,   Endo/Other  negative endocrine ROS  Renal/GU negative Renal ROS  negative genitourinary   Musculoskeletal negative musculoskeletal ROS (+)   Abdominal Normal abdominal exam  (+)   Peds negative pediatric ROS (+)  Hematology negative hematology ROS (+)   Anesthesia Other Findings Past Medical History: No date: HLD (hyperlipidemia)  Past Surgical History: No date: CESAREAN SECTION  BMI    Body Mass Index: 23.63 kg/m      Reproductive/Obstetrics negative OB ROS                             Anesthesia Physical Anesthesia Plan  ASA: 2  Anesthesia Plan: General   Post-op Pain Management:    Induction: Intravenous  PONV Risk Score and Plan: Propofol infusion and TIVA  Airway Management Planned: Natural Airway  Additional Equipment:   Intra-op Plan:   Post-operative Plan:   Informed Consent: I have reviewed the patients History and Physical, chart, labs and discussed the procedure including the risks, benefits and alternatives for the proposed anesthesia with the patient or authorized representative who has indicated his/her understanding and acceptance.     Dental Advisory Given  Plan Discussed with: CRNA and Surgeon  Anesthesia Plan Comments:         Anesthesia Quick  Evaluation

## 2022-03-18 NOTE — H&P (Signed)
     Jonathon Bellows, MD [address], [place], [place], [place], [address] [address], [place], [place], [place], [address] Phone: [phone]  Fax: [phone]  Primary Care Physician:  Mylinda Latina, PA-C   Pre-Procedure History & Physical: HPI:  Julia Watson is a 51 y.o. female is here for an colonoscopy.   Past Medical History:  Diagnosis Date   HLD (hyperlipidemia)     Past Surgical History:  Procedure Laterality Date   CESAREAN SECTION      Prior to Admission medications   Medication Sig Start Date End Date Taking? Authorizing Provider  rosuvastatin (CRESTOR) 5 MG tablet Take 1 tablet (5 mg total) by mouth daily. 02/25/22  Yes McDonough, Lauren K, PA-C  Sod Picosulfate-Mag Ox-Cit Acd (CLENPIQ) 10-3.5-12 MG-GM -GM/160ML SOLN Take 1 kit by mouth as directed. At 5 PM evening before procedure, drink 1 bottle of Clenpiq, hydrate, drink (5) 8 oz of water. Then do the same thing 5 hours prior to your procedure. Patient not taking: Reported on 08/20/2021 03/16/21   Jonathon Bellows, MD    Allergies as of 02/26/2022 - Review Complete 02/25/2022  Allergen Reaction Noted   Pollinex-t [modified tree tyrosine adsorbate]  09/29/2020    Family History  Problem Relation Age of Onset   Arthritis Mother    Breast cancer Neg Hx     Social History   Socioeconomic History   Marital status: Married    Spouse name: Not on file   Number of children: Not on file   Years of education: Not on file   Highest education level: Not on file  Occupational History   Not on file  Tobacco Use   Smoking status: Never   Smokeless tobacco: Never  Vaping Use   Vaping Use: Never used  Substance and Sexual Activity   Alcohol use: Never   Drug use: Never   Sexual activity: Not on file  Other Topics Concern   Not on file  Social History Narrative   Not on file   Social Determinants of Health   Financial Resource Strain: Not on file  Food Insecurity: Not on file  Transportation  Needs: Not on file  Physical Activity: Not on file  Stress: Not on file  Social Connections: Not on file  Intimate Partner Violence: Not on file    Review of Systems: See HPI, otherwise negative ROS  Physical Exam: BP 126/87   Pulse 98   Temp (!) 96.8 F (36 C) (Temporal)   Resp 18   Ht 5' (1.524 m)   Wt 54.9 kg   SpO2 97%   BMI 23.63 kg/m  General:   Alert,  pleasant and cooperative in NAD Head:  Normocephalic and atraumatic. Neck:  Supple; no masses or thyromegaly. Lungs:  Clear throughout to auscultation, normal respiratory effort.    Heart:  +S1, +S2, Regular rate and rhythm, No edema. Abdomen:  Soft, nontender and nondistended. Normal bowel sounds, without guarding, and without rebound.   Neurologic:  Alert and  oriented x4;  grossly normal neurologically.  Impression/Plan: Julia Watson is here for an colonoscopy to be performed for Screening colonoscopy average risk   Risks, benefits, limitations, and alternatives regarding  colonoscopy have been reviewed with the patient.  Questions have been answered.  All parties agreeable.   Jonathon Bellows, MD  03/18/2022, 9:02 AM

## 2022-03-18 NOTE — Anesthesia Procedure Notes (Signed)
Procedure Name: General with mask airway Date/Time: 03/18/2022 9:41 AM  Performed by: Kelton Pillar, CRNAPre-anesthesia Checklist: Patient identified, Emergency Drugs available, Suction available and Patient being monitored Patient Re-evaluated:Patient Re-evaluated prior to induction Oxygen Delivery Method: Simple face mask Induction Type: IV induction Placement Confirmation: positive ETCO2, CO2 detector and breath sounds checked- equal and bilateral Dental Injury: Teeth and Oropharynx as per pre-operative assessment

## 2022-03-18 NOTE — Op Note (Signed)
[facility] Gastroenterology Patient Name: [place] Procedure Date: 03/18/2022 9:03 AM MRN: [id number] Account #: [id number] Date of Birth: [birth year] Admit Type: Outpatient Age: 51 Room: [facility] ENDO ROOM 3 Gender: Female Note Status: Finalized Instrument Name: Prentice Docker [id number] Procedure:             Colonoscopy Indications:           Screening for colorectal malignant neoplasm Providers:             Wyline Mood MD, MD Referring MD:          Carlean Jews (Referring MD) Medicines:             Monitored Anesthesia Care Complications:         No immediate complications. Procedure:             Pre-Anesthesia Assessment:                        - Prior to the procedure, a History and Physical was                         performed, and patient medications, allergies and                         sensitivities were reviewed. The patient's tolerance                         of previous anesthesia was reviewed.                        - The risks and benefits of the procedure and the                         sedation options and risks were discussed with the                         patient. All questions were answered and informed                         consent was obtained.                        - ASA Grade Assessment: II - A patient with mild                         systemic disease.                        After obtaining informed consent, the colonoscope was                         passed under direct vision. Throughout the procedure,                         the patient's blood pressure, pulse, and oxygen                         saturations were monitored continuously. The                         Colonoscope was introduced  through the anus and                         advanced to the the cecum, identified by the                         appendiceal orifice. The colonoscopy was performed                         with moderate difficulty due to the lack of IV  access.                         The patient tolerated the procedure. The quality of                         the bowel preparation was excellent. Findings:      The entire examined colon appeared normal on direct and retroflexion       views. Impression:            - The entire examined colon is normal on direct and                         retroflexion views.                        - No specimens collected. Recommendation:        - Discharge patient to home (with escort).                        - Resume previous diet.                        - Continue present medications.                        - Repeat colonoscopy in 10 years for screening                         purposes. Procedure Code(s):     --- Professional ---                        [phone], Colonoscopy, flexible; diagnostic, including                         collection of specimen(s) by brushing or washing, when                         performed (separate procedure) Diagnosis Code(s):     --- Professional ---                        Z12.11, Encounter for screening for malignant neoplasm                         of colon CPT copyright 2019 American Medical Association. All rights reserved. The codes documented in this report are preliminary and upon coder review may  be revised to meet current compliance requirements. Wyline Mood, MD Wyline Mood MD, MD 03/18/2022 9:30:35 AM This report has been signed electronically. Number of Addenda: 0 Note Initiated On: 03/18/2022  9:03 AM Scope Withdrawal Time: 0 hours 6 minutes 33 seconds  Total Procedure Duration: 0 hours 14 minutes 2 seconds  Estimated Blood Loss:  Estimated blood loss: none.      [facility]

## 2022-03-18 NOTE — Anesthesia Postprocedure Evaluation (Signed)
Anesthesia Post Note  Patient: Julia Watson  Procedure(s) Performed: COLONOSCOPY WITH PROPOFOL  Patient location during evaluation: PACU Anesthesia Type: General Level of consciousness: awake and awake and alert Pain management: pain level controlled Vital Signs Assessment: post-procedure vital signs reviewed and stable Respiratory status: nonlabored ventilation and respiratory function stable Cardiovascular status: stable Anesthetic complications: no   No notable events documented.   Last Vitals:  Vitals:   03/18/22 0954 03/18/22 1004  BP: (!) 152/60 (!) 141/73  Pulse:    Resp:    Temp:    SpO2:      Last Pain:  Vitals:   03/18/22 1004  TempSrc:   PainSc: 0-No pain                 VAN STAVEREN,Tannia Contino

## 2022-03-19 ENCOUNTER — Encounter: Payer: Self-pay | Admitting: Gastroenterology

## 2022-04-22 ENCOUNTER — Encounter: Payer: Self-pay | Admitting: Physician Assistant

## 2022-04-22 ENCOUNTER — Ambulatory Visit
Admission: RE | Admit: 2022-04-22 | Discharge: 2022-04-22 | Disposition: A | Discharge status: Home / Self Care | Payer: Medicaid Other | Source: Ambulatory Visit | Attending: Physician Assistant | Admitting: Physician Assistant

## 2022-04-22 ENCOUNTER — Ambulatory Visit (INDEPENDENT_AMBULATORY_CARE_PROVIDER_SITE_OTHER): Payer: Medicaid Other | Admitting: Physician Assistant

## 2022-04-22 VITALS — BP 114/82 | HR 80 | Temp 97.7°F | Resp 16 | Ht 60.0 in | Wt 120.2 lb

## 2022-04-22 DIAGNOSIS — Z111 Encounter for screening for respiratory tuberculosis: Secondary | ICD-10-CM | POA: Diagnosis present

## 2022-04-22 DIAGNOSIS — J301 Allergic rhinitis due to pollen: Secondary | ICD-10-CM

## 2022-04-22 NOTE — Progress Notes (Signed)
[facility], [place] [address]  Internal MEDICINE  Office Visit Note  Patient Name: Julia Watson  [id number]  [id number]  Date of Service: 04/22/2022  Chief Complaint  Patient presents with   Follow-up    Patient is requesting a chest x-ray for work purposes.   Quality Metric Gaps    Tetanus vaccine   Allergies    Patient has had ongoing allergies for 2 weeks that are not improving with her Loratidine. She says last time it developed into a sinus infection.    HPI Pt is here for routine follow up -Allergies flaring past 2 weeks. Has been taking loratadine. Not using nasal spray or mucinex and will start now. Not much coughing, no SOB or wheezing. Just feels some sinus pressure/congestion. May call if worsening as ABX may be indicated then. -Had her colonoscopy done and was told everything looked good, 10 year follow up -She needs a chest xray for work. 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   Sod Picosulfate-Mag Ox-Cit Acd (CLENPIQ) 10-3.5-12 MG-GM -GM/160ML SOLN Take 1 kit by mouth as directed. At 5 PM evening before procedure, drink 1 bottle of Clenpiq, hydrate, drink (5) 8 oz of water. Then do the same thing 5 hours prior to your procedure.   No facility-administered encounter medications on file as of 04/22/2022.    Surgical History: Past Surgical History:  Procedure Laterality Date   CESAREAN SECTION     COLONOSCOPY WITH PROPOFOL N/A 03/18/2022   Procedure: COLONOSCOPY WITH PROPOFOL;  Surgeon: Jonathon Bellows, MD;  Location: [facility] ENDOSCOPY;  Service: Gastroenterology;  Laterality: N/A;    Medical History: Past Medical History:  Diagnosis Date   HLD (hyperlipidemia)     Family History: Family History   Problem Relation Age of Onset   Arthritis Mother    Breast cancer Neg Hx     Social History   Socioeconomic History   Marital status: Married    Spouse name: Not on file   Number of children: Not on file   Years of education: Not on file   Highest education level: Not on file  Occupational History   Not on file  Tobacco Use   Smoking status: Never   Smokeless tobacco: Never  Vaping Use   Vaping Use: Never used  Substance and Sexual Activity   Alcohol use: Never   Drug use: Never   Sexual activity: Not on file  Other Topics Concern   Not on file  Social History Narrative   Not on file   Social Determinants of Health   Financial Resource Strain: Not on file  Food Insecurity: Not on file  Transportation Needs: Not on file  Physical Activity: Not on file  Stress: Not on file  Social Connections: Not on file  Intimate Partner Violence: Not on file      Review of Systems  Constitutional:  Negative for chills, fatigue and unexpected weight change.  HENT:  Positive for congestion, ear pain and postnasal drip. Negative for rhinorrhea, sneezing and sore throat.   Eyes:  Negative for redness.  Respiratory:  Positive for cough. Negative for chest tightness, shortness of breath and wheezing.   Cardiovascular:  Negative for chest pain and palpitations.  Gastrointestinal:  Negative for abdominal  pain, constipation, diarrhea, nausea and vomiting.  Genitourinary:  Negative for dysuria and frequency.  Musculoskeletal:  Negative for arthralgias, back pain, joint swelling and neck pain.  Skin:  Negative for rash.  Neurological: Negative.  Negative for tremors and numbness.  Hematological:  Negative for adenopathy. Does not bruise/bleed easily.  Psychiatric/Behavioral:  Negative for behavioral problems (Depression), sleep disturbance and suicidal ideas. The patient is not nervous/anxious.     Vital Signs: BP 114/82   Pulse 80   Temp 97.7 F (36.5 C)   Resp 16   Ht 5'  (1.524 m)   Wt 120 lb 3.2 oz (54.5 kg)   SpO2 98%   BMI 23.47 kg/m    Physical Exam Vitals and nursing note reviewed.  Constitutional:      General: She is not in acute distress.    Appearance: She is well-developed and normal weight. She is not diaphoretic.  HENT:     Head: Normocephalic and atraumatic.     Right Ear: Tympanic membrane normal.     Left Ear: Tympanic membrane normal.     Nose: Congestion present.     Mouth/Throat:     Pharynx: No oropharyngeal exudate.  Eyes:     Pupils: Pupils are equal, round, and reactive to light.  Neck:     Thyroid: No thyromegaly.     Vascular: No JVD.     Trachea: No tracheal deviation.  Cardiovascular:     Rate and Rhythm: Normal rate and regular rhythm.     Heart sounds: Normal heart sounds. No murmur heard.    No friction rub. No gallop.  Pulmonary:     Effort: Pulmonary effort is normal. No respiratory distress.     Breath sounds: No wheezing or rales.  Chest:     Chest wall: No tenderness.  Breasts:    Right: Normal. No mass.     Left: Normal. No mass.  Abdominal:     General: Bowel sounds are normal.     Palpations: Abdomen is soft.  Musculoskeletal:        General: Normal range of motion.     Cervical back: Normal range of motion and neck supple.  Lymphadenopathy:     Cervical: No cervical adenopathy.  Skin:    General: Skin is warm and dry.  Neurological:     Mental Status: She is alert and oriented to person, place, and time.     Cranial Nerves: No cranial nerve deficit.  Psychiatric:        Behavior: Behavior normal.        Thought Content: Thought content normal.        Judgment: Judgment normal.        Assessment/Plan: 1. Seasonal allergic rhinitis due to pollen Will continue loratadine and will start mucinex and flonase. If worsening may call back and ABX given if needed then  2. Screening-pulmonary TB Will order chest xray for TB screening for work requirement - DG Chest 2 View;  Future   General Counseling: [place] verbalizes understanding of the findings of todays visit and agrees with plan of treatment. I have discussed any further diagnostic evaluation that may be needed or ordered today. We also reviewed her medications today. she has been encouraged to call the office with any questions or concerns that should arise related to todays visit.    Orders Placed This Encounter  Procedures   DG Chest 2 View    No orders of the defined types were placed in this  encounter.   This patient was seen by Drema Dallas, PA-C in collaboration with Dr. Clayborn Bigness as a part of collaborative care agreement.   Total time spent:30 Minutes Time spent includes review of chart, medications, test results, and follow up plan with the patient.      Dr Lavera Guise Internal medicine

## 2022-04-23 ENCOUNTER — Other Ambulatory Visit: Payer: Self-pay | Admitting: Physician Assistant

## 2022-04-23 DIAGNOSIS — J0100 Acute maxillary sinusitis, unspecified: Secondary | ICD-10-CM

## 2022-04-23 MED ORDER — AMOXICILLIN-POT CLAVULANATE 875-125 MG PO TABS: 1.0000 | ORAL_TABLET | Freq: Two times a day (BID) | ORAL | 0 refills | Status: DC

## 2022-04-26 ENCOUNTER — Telehealth: Payer: Self-pay

## 2022-04-26 NOTE — Telephone Encounter (Signed)
Tried to call patient to let her know that chest x-ray was clear and did not show any signs of TB, but voicemail box was full.

## 2022-04-26 NOTE — Telephone Encounter (Signed)
-----   Message from Mylinda Latina, PA-C sent at 04/26/2022  1:13 PM EDT ----- Please let her know that her CXR was stable from last year and did not show signs of TB.

## 2022-07-07 ENCOUNTER — Ambulatory Visit: Payer: Medicaid Other | Admitting: Nurse Practitioner

## 2022-07-09 ENCOUNTER — Ambulatory Visit: Payer: Medicaid Other | Admitting: Nurse Practitioner

## 2022-07-09 ENCOUNTER — Encounter: Payer: Self-pay | Admitting: Nurse Practitioner

## 2022-07-09 VITALS — BP 117/75 | HR 100 | Temp 98.3°F | Resp 16 | Ht 60.0 in | Wt 122.6 lb

## 2022-07-09 DIAGNOSIS — R052 Subacute cough: Secondary | ICD-10-CM | POA: Diagnosis not present

## 2022-07-09 DIAGNOSIS — R0602 Shortness of breath: Secondary | ICD-10-CM

## 2022-07-09 DIAGNOSIS — J069 Acute upper respiratory infection, unspecified: Secondary | ICD-10-CM | POA: Diagnosis not present

## 2022-07-09 MED ORDER — BENZONATATE 200 MG PO CAPS: 200.0000 mg | ORAL_CAPSULE | Freq: Two times a day (BID) | ORAL | 2 refills | Status: DC | PRN

## 2022-07-09 MED ORDER — IPRATROPIUM-ALBUTEROL 20-100 MCG/ACT IN AERS: 1.0000 | INHALATION_SPRAY | Freq: Four times a day (QID) | RESPIRATORY_TRACT | 3 refills | Status: DC

## 2022-07-09 MED ORDER — IPRATROPIUM-ALBUTEROL 0.5-2.5 (3) MG/3ML IN SOLN: 3.0000 mL | RESPIRATORY_TRACT | 2 refills | Status: DC | PRN

## 2022-07-09 NOTE — Progress Notes (Signed)
[facility], [place] [address]  Internal MEDICINE  Office Visit Note  Patient Name: Julia Watson  [id number]  [id number]  Date of Service: 07/09/2022  Chief Complaint  Patient presents with   Acute Visit    Getting over the flu      HPI [name] presents for an acute sick visit for residual symptoms after flu B Getting over flu -- had it 06/19/22 Still have SOB, cough, headache     Current Medication:  Outpatient Encounter Medications as of 07/09/2022  Medication Sig   amoxicillin-clavulanate (AUGMENTIN) 875-125 MG tablet Take 1 tablet by mouth 2 (two) times daily. Take with food.   benzonatate (TESSALON) 200 MG capsule Take 1 capsule (200 mg total) by mouth 2 (two) times daily as needed for cough.   Ipratropium-Albuterol (COMBIVENT) 20-100 MCG/ACT AERS respimat Inhale 1 puff into the lungs every 6 (six) hours.   ipratropium-albuterol (DUONEB) 0.5-2.5 (3) MG/3ML SOLN Take 3 mLs by nebulization every 4 (four) hours as needed.   rosuvastatin (CRESTOR) 5 MG tablet Take 1 tablet (5 mg total) by mouth daily.   Sod Picosulfate-Mag Ox-Cit Acd (CLENPIQ) 10-3.5-12 MG-GM -GM/160ML SOLN Take 1 kit by mouth as directed. At 5 PM evening before procedure, drink 1 bottle of Clenpiq, hydrate, drink (5) 8 oz of water. Then do the same thing 5 hours prior to your procedure.   No facility-administered encounter medications on file as of 07/09/2022.      Medical History: Past Medical History:  Diagnosis Date   HLD (hyperlipidemia)      Vital Signs: BP 117/75   Pulse 100   Temp 98.3 F (36.8 C)   Resp 16   Ht 5' (1.524 m)   Wt 122 lb 9.6 oz (55.6 kg)   SpO2 99%   BMI 23.94 kg/m    Review of Systems  Constitutional:  Positive for fatigue. Negative for chills and fever.  HENT:  Positive for postnasal drip. Negative for congestion, ear pain, rhinorrhea, sinus pressure, sinus pain, sneezing, sore throat and trouble swallowing.   Respiratory:   Positive for cough and shortness of breath. Negative for chest tightness and wheezing.   Cardiovascular: Negative.  Negative for chest pain and palpitations.  Gastrointestinal: Negative.   Neurological:  Positive for headaches.    Physical Exam Vitals reviewed.  Constitutional:      General: She is not in acute distress.    Appearance: Normal appearance. She is normal weight. She is not ill-appearing.  HENT:     Head: Normocephalic and atraumatic.  Eyes:     Pupils: Pupils are equal, round, and reactive to light.  Cardiovascular:     Rate and Rhythm: Normal rate and regular rhythm.     Heart sounds: Normal heart sounds. No murmur heard. Pulmonary:     Effort: Pulmonary effort is normal. No respiratory distress.     Breath sounds: Normal breath sounds. No wheezing.  Neurological:     Mental Status: She is alert and oriented to person, place, and time.  Psychiatric:        Mood and Affect: Mood normal.        Behavior: Behavior normal.       Assessment/Plan: 1. Subacute cough Medication prescribed for symptomatic relief of cough as she is getting over residual symptoms from the flu - benzonatate (TESSALON) 200 MG capsule; Take 1 capsule (200 mg total) by mouth 2 (two) times daily as needed for cough.  Dispense: 60 capsule; Refill: 2  2. SOB (shortness of breath) Neb treatment and a rescue inhaler prescribed for SOB  - ipratropium-albuterol (DUONEB) 0.5-2.5 (3) MG/3ML SOLN; Take 3 mLs by nebulization every 4 (four) hours as needed.  Dispense: 360 mL; Refill: 2 - Ipratropium-Albuterol (COMBIVENT) 20-100 MCG/ACT AERS respimat; Inhale 1 puff into the lungs every 6 (six) hours.  Dispense: 4 g; Refill: 3  3. Recent URI Experiencing residual symptoms, prn medications prescribed for symptomatic relief. - ipratropium-albuterol (DUONEB) 0.5-2.5 (3) MG/3ML SOLN; Take 3 mLs by nebulization every 4 (four) hours as needed.  Dispense: 360 mL; Refill: 2 - Ipratropium-Albuterol (COMBIVENT)  20-100 MCG/ACT AERS respimat; Inhale 1 puff into the lungs every 6 (six) hours.  Dispense: 4 g; Refill: 3 - benzonatate (TESSALON) 200 MG capsule; Take 1 capsule (200 mg total) by mouth 2 (two) times daily as needed for cough.  Dispense: 60 capsule; Refill: 2    General Counseling: [place] verbalizes understanding of the findings of todays visit and agrees with plan of treatment. I have discussed any further diagnostic evaluation that may be needed or ordered today. We also reviewed her medications today. she has been encouraged to call the office with any questions or concerns that should arise related to todays visit.    Counseling:    No orders of the defined types were placed in this encounter.   Meds ordered this encounter  Medications   ipratropium-albuterol (DUONEB) 0.5-2.5 (3) MG/3ML SOLN    Sig: Take 3 mLs by nebulization every 4 (four) hours as needed.    Dispense:  360 mL    Refill:  2   Ipratropium-Albuterol (COMBIVENT) 20-100 MCG/ACT AERS respimat    Sig: Inhale 1 puff into the lungs every 6 (six) hours.    Dispense:  4 g    Refill:  3   benzonatate (TESSALON) 200 MG capsule    Sig: Take 1 capsule (200 mg total) by mouth 2 (two) times daily as needed for cough.    Dispense:  60 capsule    Refill:  2    Return if symptoms worsen or fail to improve.  [place] Controlled Substance Database was reviewed by me for overdose risk score (ORS)  Time spent:30 Minutes Time spent with patient included reviewing progress notes, labs, imaging studies, and discussing plan for follow up.   This patient was seen by Jonetta Osgood, FNP-C in collaboration with Dr. Clayborn Bigness as a part of collaborative care agreement.  Cresencio Reesor R. Valetta Fuller, MSN, FNP-C Internal Medicine

## 2022-08-26 ENCOUNTER — Ambulatory Visit: Payer: Medicaid Other | Admitting: Physician Assistant

## 2023-01-02 IMAGING — MG MM DIGITAL SCREENING BILAT W/ TOMO AND CAD
8 series · 8 of 24 positions shown · non-contrast
Comparison: None.

CLINICAL DATA: Screening.

EXAM:
DIGITAL SCREENING BILATERAL MAMMOGRAM WITH TOMOSYNTHESIS AND CAD
TECHNIQUE: Bilateral screening digital craniocaudal and mediolateral oblique
mammograms were obtained. Bilateral screening digital breast
tomosynthesis was performed. The images were evaluated with
computer-aided detection.

[L CC synth-2D]
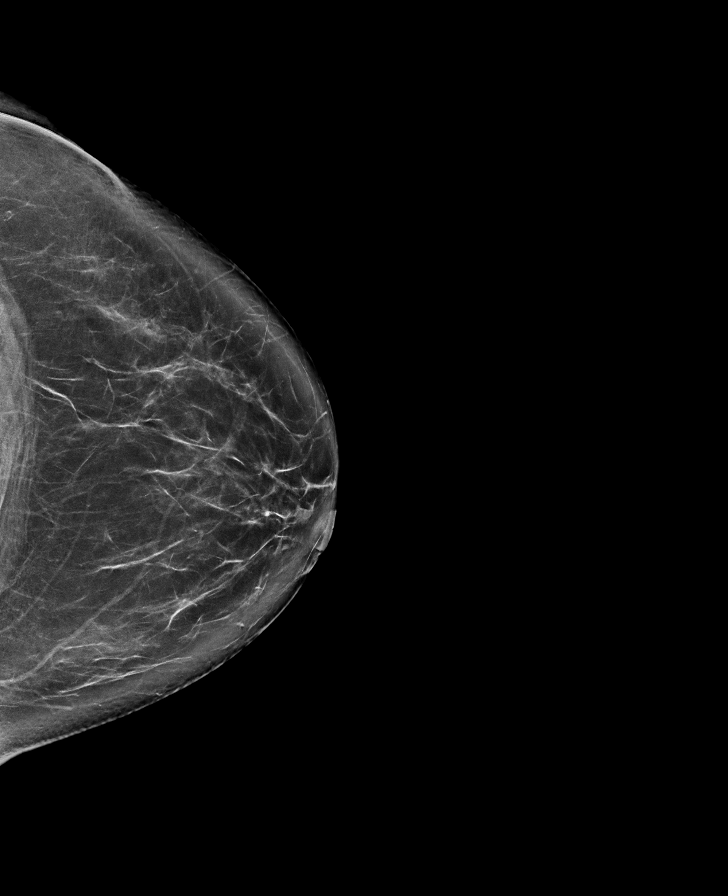

[R CC synth-2D]
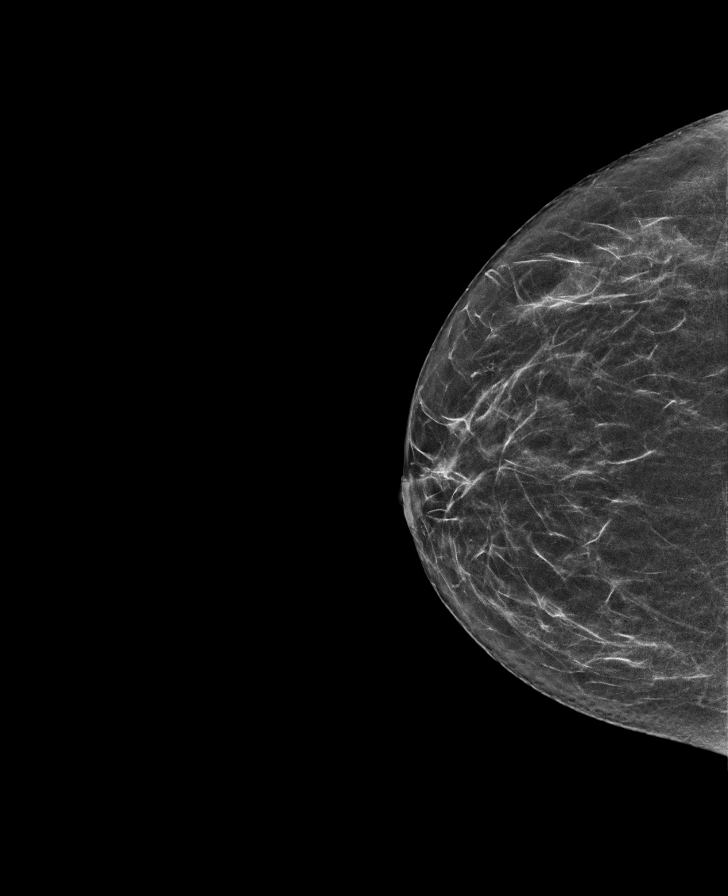

[L MLO synth-2D]
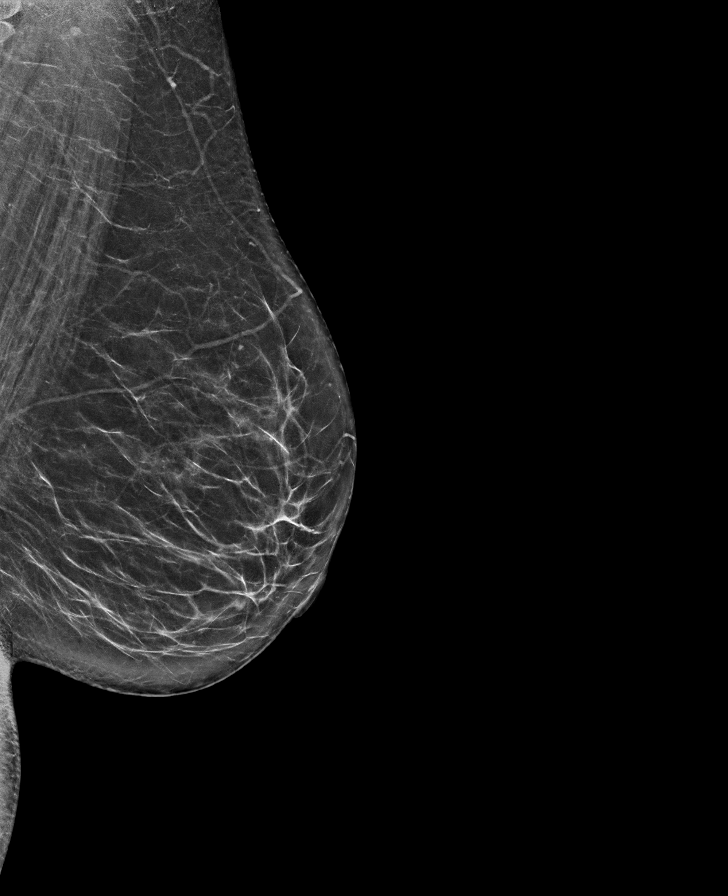

[R MLO synth-2D]
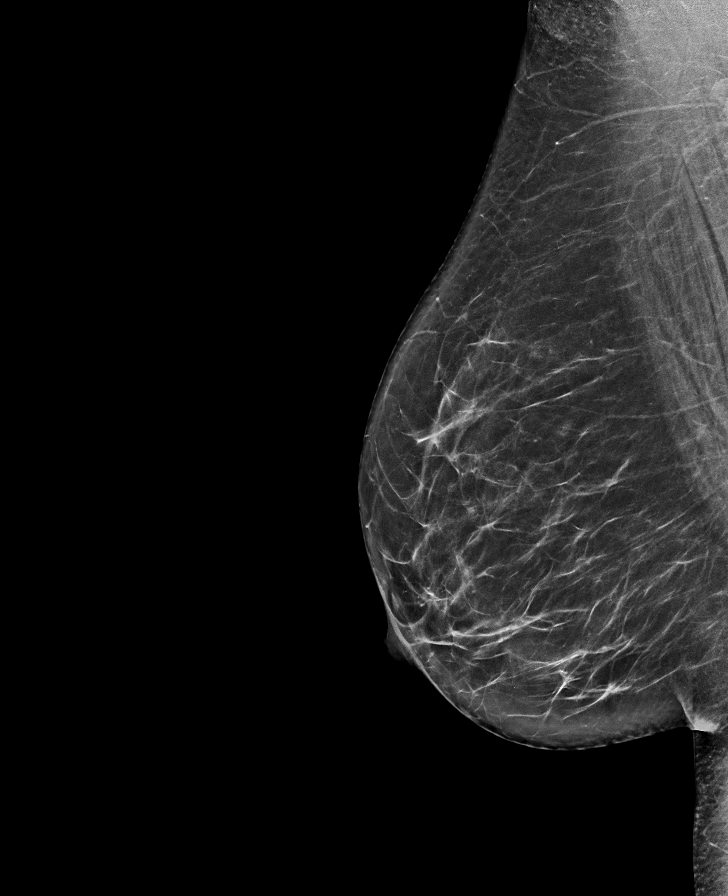

[L CC tomo · tomo slice 44/87.0]
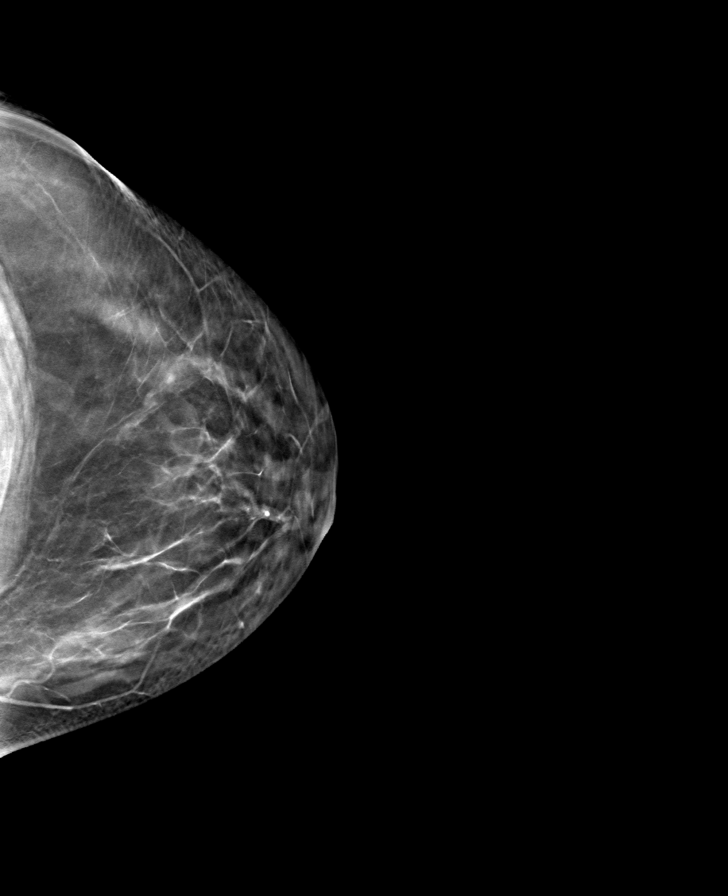

[L MLO tomo · tomo slice 38/75.0]
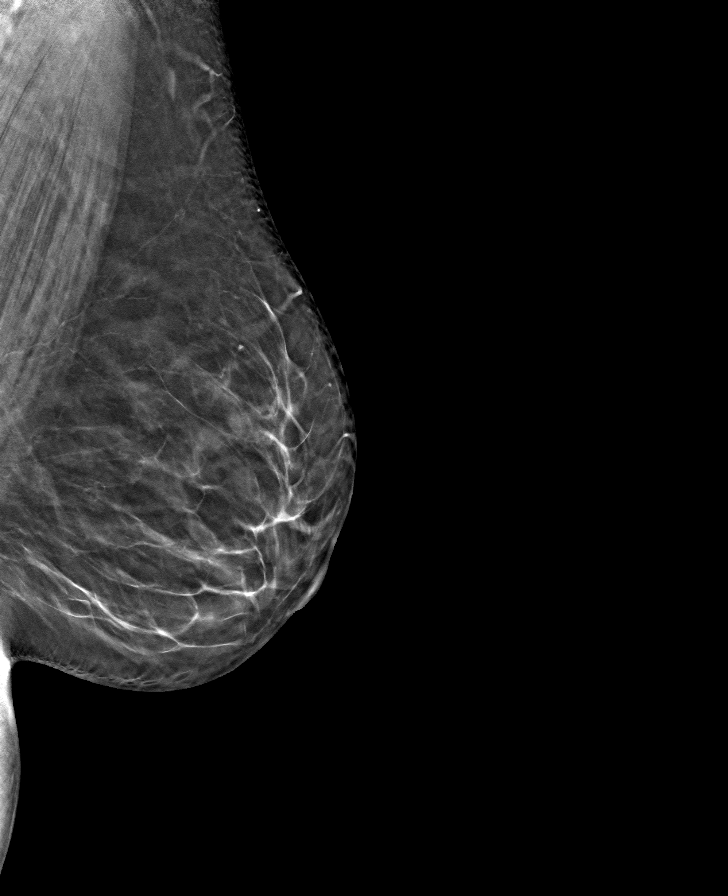

[R CC tomo · tomo slice 29/58.0]
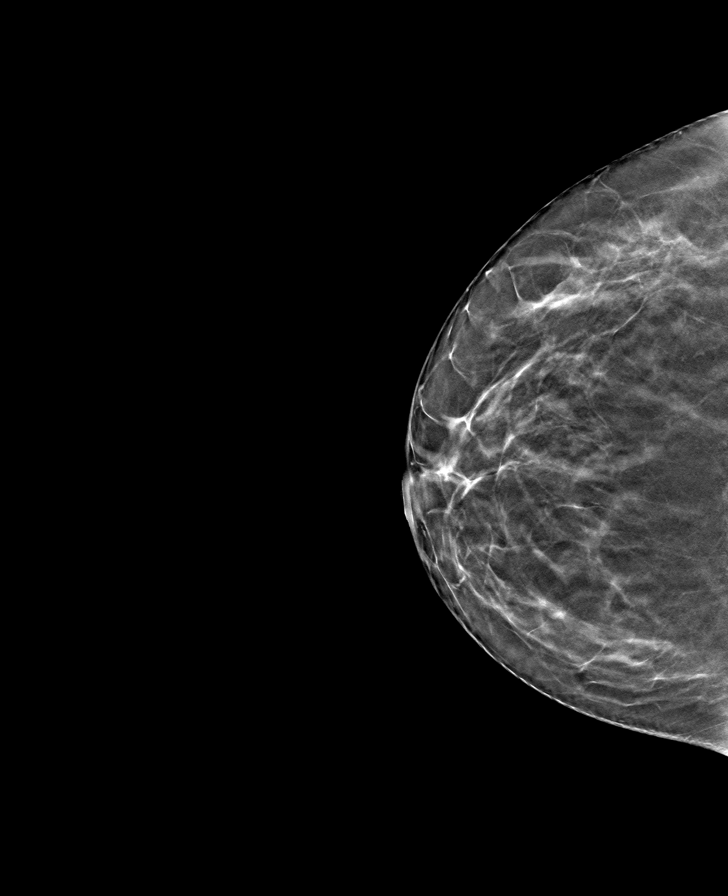

[R MLO tomo · tomo slice 40/79.0]
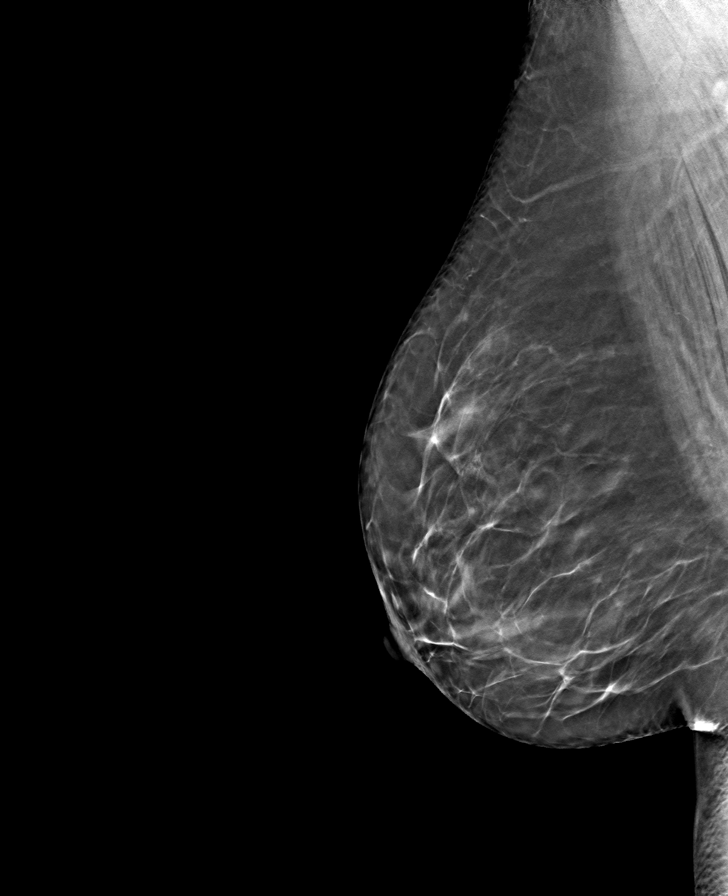

[8 of 24 positions shown; findings below may reference images not displayed]

ACR Breast Density Category b: There are scattered areas of
fibroglandular density.
FINDINGS: There are no findings suspicious for malignancy. The images were
evaluated with computer-aided detection.
IMPRESSION: No mammographic evidence of malignancy. A result letter of this
screening mammogram will be mailed directly to the patient.

RECOMMENDATION:
Screening mammogram in one year. (Code:C7-6-ASJ)

BI-RADS CATEGORY  1: Negative.

## 2023-03-04 ENCOUNTER — Ambulatory Visit (INDEPENDENT_AMBULATORY_CARE_PROVIDER_SITE_OTHER): Payer: Medicaid Other | Admitting: Physician Assistant

## 2023-03-04 ENCOUNTER — Encounter: Payer: Self-pay | Admitting: Physician Assistant

## 2023-03-04 VITALS — BP 125/80 | HR 64 | Temp 98.2°F | Resp 16 | Ht 60.0 in | Wt 115.4 lb

## 2023-03-04 DIAGNOSIS — R5383 Other fatigue: Secondary | ICD-10-CM

## 2023-03-04 DIAGNOSIS — E7800 Pure hypercholesterolemia, unspecified: Secondary | ICD-10-CM

## 2023-03-04 DIAGNOSIS — Z1231 Encounter for screening mammogram for malignant neoplasm of breast: Secondary | ICD-10-CM | POA: Diagnosis not present

## 2023-03-04 DIAGNOSIS — E559 Vitamin D deficiency, unspecified: Secondary | ICD-10-CM

## 2023-03-04 DIAGNOSIS — Z01419 Encounter for gynecological examination (general) (routine) without abnormal findings: Secondary | ICD-10-CM | POA: Diagnosis not present

## 2023-03-04 DIAGNOSIS — R946 Abnormal results of thyroid function studies: Secondary | ICD-10-CM

## 2023-03-04 DIAGNOSIS — R300 Dysuria: Secondary | ICD-10-CM

## 2023-03-04 DIAGNOSIS — Z0001 Encounter for general adult medical examination with abnormal findings: Secondary | ICD-10-CM

## 2023-03-04 DIAGNOSIS — Z111 Encounter for screening for respiratory tuberculosis: Secondary | ICD-10-CM | POA: Diagnosis not present

## 2023-03-04 MED ORDER — TETANUS-DIPHTH-ACELL PERTUSSIS 5-2-15.5 LF-MCG/0.5 IM SUSP: 0.5000 mL | Freq: Once | INTRAMUSCULAR | 0 refills | Status: AC

## 2023-03-04 MED ORDER — SHINGRIX 50 MCG/0.5ML IM SUSR: 0.5000 mL | Freq: Once | INTRAMUSCULAR | 0 refills | Status: AC

## 2023-03-04 NOTE — Progress Notes (Signed)
[facility] [address] [place], [name] [address]  Internal MEDICINE  Office Visit Note  Patient Name: Julia Watson  [id number]  [id number]  Date of Service: 03/04/2023  Chief Complaint  Patient presents with   Annual Exam   Hyperlipidemia   Quality Metric Gaps    Mammogram, TDAP and Shingles     HPI Pt is here for routine health maintenance examination -Recently had a stomach bug and now her kids are sick but she is feeling well now -needs shingrix and tdap sent to pharmacy -Needs annual TB screening--requests chest xray for work. Discussed there is a Quant TB blood test option as well and and she may check with work if they are ok with this instead in future. Would like Chest xray ordered for now. -Periods irregular for awhile and now 2 months since last cycle, perimenopausal symptoms -Due for mammogram and labs. Not taking crestor, states she never refilled it--no problems with it just forgot about it. Will check labs and consider restarting if indicated -UTD on colon screening and pap  Current Medication: Outpatient Encounter Medications as of 03/04/2023  Medication Sig   Ipratropium-Albuterol (COMBIVENT) 20-100 MCG/ACT AERS respimat Inhale 1 puff into the lungs every 6 (six) hours.   ipratropium-albuterol (DUONEB) 0.5-2.5 (3) MG/3ML SOLN Take 3 mLs by nebulization every 4 (four) hours as needed.   [DISCONTINUED] amoxicillin-clavulanate (AUGMENTIN) 875-125 MG tablet Take 1 tablet by mouth 2 (two) times daily. Take with food.   [DISCONTINUED] benzonatate (TESSALON) 200 MG capsule Take 1 capsule (200 mg total) by mouth 2 (two) times daily as needed for cough.   [DISCONTINUED] rosuvastatin (CRESTOR) 5 MG tablet Take 1 tablet (5 mg total) by mouth daily.   [DISCONTINUED] Sod Picosulfate-Mag Ox-Cit Acd (CLENPIQ) 10-3.5-12 MG-GM -GM/160ML SOLN Take 1 kit by mouth as directed. At 5 PM evening before procedure, drink 1 bottle of Clenpiq, hydrate, drink (5) 8 oz of  water. Then do the same thing 5 hours prior to your procedure.   [DISCONTINUED] Tdap (ADACEL) 10-27-13.5 LF-MCG/0.5 injection Inject 0.5 mLs into the muscle once.   [DISCONTINUED] Zoster Vaccine Adjuvanted [facility]) injection Inject 0.5 mLs into the muscle once.   Tdap (ADACEL) 10-27-13.5 LF-MCG/0.5 injection Inject 0.5 mLs into the muscle once for 1 dose.   Zoster Vaccine Adjuvanted [facility]) injection Inject 0.5 mLs into the muscle once for 1 dose.   No facility-administered encounter medications on file as of 03/04/2023.    Surgical History: Past Surgical History:  Procedure Laterality Date   CESAREAN SECTION     COLONOSCOPY WITH PROPOFOL N/A 03/18/2022   Procedure: COLONOSCOPY WITH PROPOFOL;  Surgeon: Wyline Mood, MD;  Location: [facility] ENDOSCOPY;  Service: Gastroenterology;  Laterality: N/A;    Medical History: Past Medical History:  Diagnosis Date   HLD (hyperlipidemia)     Family History: Family History  Problem Relation Age of Onset   Arthritis Mother    Breast cancer Neg Hx       Review of Systems  Constitutional:  Negative for chills, fatigue and unexpected weight change.  HENT:  Negative for congestion, postnasal drip, rhinorrhea, sneezing and sore throat.   Eyes:  Negative for redness.  Respiratory:  Negative for cough, chest tightness and shortness of breath.   Cardiovascular:  Negative for chest pain and palpitations.  Gastrointestinal:  Negative for abdominal pain, constipation, diarrhea, nausea and vomiting.  Genitourinary:  Positive for menstrual problem. Negative for dysuria and frequency.  Musculoskeletal:  Negative for arthralgias, back pain, joint swelling and  neck pain.  Skin:  Negative for rash.  Neurological: Negative.  Negative for tremors and numbness.  Hematological:  Negative for adenopathy. Does not bruise/bleed easily.  Psychiatric/Behavioral:  Negative for behavioral problems (Depression), sleep disturbance and suicidal ideas. The patient is not  nervous/anxious.      Vital Signs: BP 125/80   Pulse 64   Temp 98.2 F (36.8 C)   Resp 16   Ht 5' (1.524 m)   Wt 115 lb 6.4 oz (52.3 kg)   SpO2 97%   BMI 22.54 kg/m    Physical Exam Vitals and nursing note reviewed.  Constitutional:      General: She is not in acute distress.    Appearance: She is well-developed and normal weight. She is not diaphoretic.  HENT:     Head: Normocephalic and atraumatic.     Mouth/Throat:     Pharynx: No oropharyngeal exudate.  Eyes:     Pupils: Pupils are equal, round, and reactive to light.  Neck:     Thyroid: No thyromegaly.     Vascular: No JVD.     Trachea: No tracheal deviation.  Cardiovascular:     Rate and Rhythm: Normal rate and regular rhythm.     Heart sounds: Normal heart sounds. No murmur heard.    No friction rub. No gallop.  Pulmonary:     Effort: Pulmonary effort is normal. No respiratory distress.     Breath sounds: No wheezing or rales.  Chest:     Chest wall: No tenderness.  Breasts:    Right: Normal. No mass.     Left: Normal. No mass.  Abdominal:     General: Bowel sounds are normal.     Palpations: Abdomen is soft.     Tenderness: There is no abdominal tenderness.  Musculoskeletal:        General: Normal range of motion.     Cervical back: Normal range of motion and neck supple.  Lymphadenopathy:     Cervical: No cervical adenopathy.  Skin:    General: Skin is warm and dry.  Neurological:     Mental Status: She is alert and oriented to person, place, and time.     Cranial Nerves: No cranial nerve deficit.  Psychiatric:        Behavior: Behavior normal.        Thought Content: Thought content normal.        Judgment: Judgment normal.      LABS: No results found for this or any previous visit (from the past 2160 hour(s)).      Assessment/Plan: 1. Encounter for general adult medical examination with abnormal findings CPE performed, labs and mammogram ordered, UTD on colon screening and pap, due  for shingles and tdap  2. Pure hypercholesterolemia - Lipid Panel With LDL/HDL Ratio  3. Visit for screening mammogram - MM DIGITAL SCREENING BILATERAL; Future  4. Visit for gynecologic examination Breast exam performed  5. Screening-pulmonary TB - DG Chest 2 View; Future  6. Vitamin D deficiency - VITAMIN D 25 Hydroxy (Vit-D Deficiency, Fractures)  7. Abnormal thyroid exam - TSH + free T4  8. Other fatigue - CBC w/Diff/Platelet - Comprehensive metabolic panel - Lipid Panel With LDL/HDL Ratio - TSH + free T4 - VITAMIN D 25 Hydroxy (Vit-D Deficiency, Fractures)  9. Dysuria - UA/M w/rflx Culture, Routine   General Counseling: [place] verbalizes understanding of the findings of todays visit and agrees with plan of treatment. I have discussed any further diagnostic evaluation  that may be needed or ordered today. We also reviewed her medications today. she has been encouraged to call the office with any questions or concerns that should arise related to todays visit.    Counseling:    Orders Placed This Encounter  Procedures   MM DIGITAL SCREENING BILATERAL   DG Chest 2 View   UA/M w/rflx Culture, Routine   CBC w/Diff/Platelet   Comprehensive metabolic panel   Lipid Panel With LDL/HDL Ratio   TSH + free T4   VITAMIN D 25 Hydroxy (Vit-D Deficiency, Fractures)    Meds ordered this encounter  Medications   Zoster Vaccine Adjuvanted [facility]) injection    Sig: Inject 0.5 mLs into the muscle once for 1 dose.    Dispense:  0.5 mL    Refill:  0   Tdap (ADACEL) 10-27-13.5 LF-MCG/0.5 injection    Sig: Inject 0.5 mLs into the muscle once for 1 dose.    Dispense:  0.5 mL    Refill:  0    This patient was seen by Lynn Ito, PA-C in collaboration with Dr. Beverely Risen as a part of collaborative care agreement.  Total time spent:35 Minutes  Time spent includes review of chart, medications, test results, and follow up plan with the patient.     Lyndon Code,  MD  Internal Medicine

## 2023-03-09 LAB — UA/M W/RFLX CULTURE, ROUTINE
Bilirubin, UA: NEGATIVE
Glucose, UA: NEGATIVE
Ketones, UA: NEGATIVE
Nitrite, UA: NEGATIVE
Protein,UA: NEGATIVE
RBC, UA: NEGATIVE
Specific Gravity, UA: 1.011 (ref 1.005–1.030)
Urobilinogen, Ur: 0.2 mg/dL (ref 0.2–1.0)
pH, UA: 6.5 (ref 5.0–7.5)

## 2023-03-09 LAB — MICROSCOPIC EXAMINATION
Bacteria, UA: NONE SEEN
Casts: NONE SEEN /LPF

## 2023-03-09 LAB — URINE CULTURE, REFLEX

## 2023-03-10 ENCOUNTER — Telehealth: Payer: Self-pay

## 2023-03-10 MED ORDER — NITROFURANTOIN MONOHYD MACRO 100 MG PO CAPS: 100.0000 mg | ORAL_CAPSULE | Freq: Two times a day (BID) | ORAL | 0 refills | Status: DC

## 2023-03-10 NOTE — Telephone Encounter (Signed)
Spoke with patient regarding urine culture results and sent Macrobid.

## 2023-03-10 NOTE — Telephone Encounter (Signed)
-----   Message from Carlean Jews sent at 03/10/2023  2:12 PM EDT ----- Please let her know she has a UTI and send macrobid x10days

## 2023-03-22 ENCOUNTER — Encounter: Payer: Self-pay | Admitting: Nurse Practitioner

## 2023-03-22 ENCOUNTER — Ambulatory Visit
Admission: RE | Admit: 2023-03-22 | Discharge: 2023-03-22 | Disposition: A | Discharge status: Home / Self Care | Payer: Medicaid Other | Attending: Nurse Practitioner | Admitting: Nurse Practitioner

## 2023-03-22 ENCOUNTER — Ambulatory Visit
Admission: RE | Admit: 2023-03-22 | Discharge: 2023-03-22 | Disposition: A | Discharge status: Home / Self Care | Payer: Medicaid Other | Source: Ambulatory Visit | Attending: Nurse Practitioner | Admitting: Nurse Practitioner

## 2023-03-22 ENCOUNTER — Ambulatory Visit: Payer: Medicaid Other | Admitting: Nurse Practitioner

## 2023-03-22 VITALS — BP 125/82 | HR 75 | Temp 98.3°F | Resp 16 | Ht 60.0 in | Wt 114.4 lb

## 2023-03-22 DIAGNOSIS — M79641 Pain in right hand: Secondary | ICD-10-CM

## 2023-03-22 DIAGNOSIS — M79644 Pain in right finger(s): Secondary | ICD-10-CM | POA: Insufficient documentation

## 2023-03-22 NOTE — Progress Notes (Signed)
[facility] [address] [place], [name] [address]  Internal MEDICINE  Office Visit Note  Patient Name: Julia Watson  [id number]  [id number]  Date of Service: 03/22/2023  Chief Complaint  Patient presents with   Acute Visit    Right hand pain     HPI Julia Watson presents for an acute sick visit for right hand/thumb pain  --onset of symptoms was 2 weeks ago Right thumb pain, decreased ROM, unable to bed thumb all the way. Denies any numbness or tingling No radiation of pain to other locations either.  Tenderness of the distal joint of the thumb when palpated.      Current Medication:  Outpatient Encounter Medications as of 03/22/2023  Medication Sig   Ipratropium-Albuterol (COMBIVENT) 20-100 MCG/ACT AERS respimat Inhale 1 puff into the lungs every 6 (six) hours.   ipratropium-albuterol (DUONEB) 0.5-2.5 (3) MG/3ML SOLN Take 3 mLs by nebulization every 4 (four) hours as needed.   nitrofurantoin, macrocrystal-monohydrate, (MACROBID) 100 MG capsule Take 1 capsule (100 mg total) by mouth 2 (two) times daily.   No facility-administered encounter medications on file as of 03/22/2023.      Medical History: Past Medical History:  Diagnosis Date   HLD (hyperlipidemia)      Vital Signs: BP 125/82   Pulse 75   Temp 98.3 F (36.8 C)   Resp 16   Ht 5' (1.524 m)   Wt 114 lb 6.4 oz (51.9 kg)   SpO2 97%   BMI 22.34 kg/m    Review of Systems  Constitutional:  Negative for chills, fatigue and unexpected weight change.  HENT:  Positive for postnasal drip. Negative for congestion, rhinorrhea, sneezing and sore throat.   Eyes:  Negative for redness.  Respiratory:  Negative for cough, chest tightness and shortness of breath.   Cardiovascular:  Negative for chest pain and palpitations.  Gastrointestinal:  Negative for abdominal pain, constipation, diarrhea, nausea and vomiting.  Genitourinary:  Negative for dysuria and frequency.  Musculoskeletal:  Positive for  arthralgias and joint swelling (right thumb). Negative for back pain and neck pain.  Skin:  Negative for rash.  Neurological: Negative.  Negative for tremors and numbness.  Hematological:  Negative for adenopathy. Does not bruise/bleed easily.  Psychiatric/Behavioral:  Negative for behavioral problems (Depression), sleep disturbance and suicidal ideas. The patient is not nervous/anxious.     Physical Exam Vitals reviewed.  Constitutional:      General: She is not in acute distress.    Appearance: Normal appearance. She is normal weight. She is not ill-appearing.  HENT:     Head: Normocephalic and atraumatic.  Eyes:     Pupils: Pupils are equal, round, and reactive to light.  Cardiovascular:     Rate and Rhythm: Normal rate and regular rhythm.     Heart sounds: Normal heart sounds. No murmur heard. Pulmonary:     Effort: Pulmonary effort is normal. No respiratory distress.     Breath sounds: Normal breath sounds. No wheezing.  Musculoskeletal:     Right hand: Swelling and tenderness present. Decreased range of motion (thumb). Decreased strength.  Neurological:     Mental Status: She is alert and oriented to person, place, and time.  Psychiatric:        Mood and Affect: Mood normal.        Behavior: Behavior normal.       Assessment/Plan: 1. Pain of right thumb Possible trigger finger of the right thumb, xrays ordered to rule out abnormalities or fractures  -  DG Finger Thumb Right; Future - DG Hand Complete Right; Future  2. Right hand pain Xrays ordered to rule out abnormalities or fractures.  - DG Finger Thumb Right; Future - DG Hand Complete Right; Future   General Counseling: Julia Watson verbalizes understanding of the findings of todays visit and agrees with plan of treatment. I have discussed any further diagnostic evaluation that may be needed or ordered today. We also reviewed her medications today. she has been encouraged to call the office with any questions or  concerns that should arise related to todays visit.    Counseling:    Orders Placed This Encounter  Procedures   DG Finger Thumb Right   DG Hand Complete Right    No orders of the defined types were placed in this encounter.   Return if symptoms worsen or fail to improve, for will call with xray results and then refer to orthopedic most likely. .  [place] Controlled Substance Database was reviewed by me for overdose risk score (ORS)  Time spent:20 Minutes Time spent with patient included reviewing progress notes, labs, imaging studies, and discussing plan for follow up.   This patient was seen by Sallyanne Kuster, FNP-C in collaboration with Dr. Beverely Risen as a part of collaborative care agreement.  Julia Dina R. Tedd Sias, MSN, FNP-C Internal Medicine

## 2023-03-28 ENCOUNTER — Telehealth: Payer: Self-pay

## 2023-03-28 DIAGNOSIS — M79641 Pain in right hand: Secondary | ICD-10-CM

## 2023-03-28 DIAGNOSIS — M79644 Pain in right finger(s): Secondary | ICD-10-CM

## 2023-04-06 ENCOUNTER — Telehealth: Payer: Self-pay | Admitting: Physician Assistant

## 2023-04-06 NOTE — Telephone Encounter (Signed)
Orthopedic referral sent via [facility] to [facility]. Notified patient. Gave pt telephone # [phone]

## 2023-04-13 ENCOUNTER — Telehealth: Payer: Self-pay | Admitting: Physician Assistant

## 2023-04-13 NOTE — Telephone Encounter (Signed)
Per Marquis Buggy, referral has been closed due to no return call from patient -Sheralyn Boatman

## 2023-04-27 ENCOUNTER — Other Ambulatory Visit: Payer: Self-pay

## 2023-04-27 ENCOUNTER — Emergency Department (HOSPITAL_COMMUNITY)
Admission: EM | Admit: 2023-04-27 | Discharge: 2023-04-27 | Discharge status: Against Medical Advice | Payer: Medicaid Other | Source: Home / Self Care

## 2023-04-27 DIAGNOSIS — S161XXA Strain of muscle, fascia and tendon at neck level, initial encounter: Secondary | ICD-10-CM | POA: Insufficient documentation

## 2023-04-27 DIAGNOSIS — S39012A Strain of muscle, fascia and tendon of lower back, initial encounter: Secondary | ICD-10-CM | POA: Insufficient documentation

## 2023-04-27 DIAGNOSIS — Y92410 Unspecified street and highway as the place of occurrence of the external cause: Secondary | ICD-10-CM | POA: Diagnosis not present

## 2023-04-27 DIAGNOSIS — S199XXA Unspecified injury of neck, initial encounter: Secondary | ICD-10-CM | POA: Diagnosis present

## 2023-04-27 DIAGNOSIS — M25331 Other instability, right wrist: Secondary | ICD-10-CM | POA: Diagnosis not present
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
## 2023-04-28 ENCOUNTER — Emergency Department (HOSPITAL_BASED_OUTPATIENT_CLINIC_OR_DEPARTMENT_OTHER): Payer: No Typology Code available for payment source | Admitting: Radiology

## 2023-04-28 ENCOUNTER — Emergency Department (HOSPITAL_BASED_OUTPATIENT_CLINIC_OR_DEPARTMENT_OTHER): Payer: No Typology Code available for payment source

## 2023-04-28 ENCOUNTER — Emergency Department (HOSPITAL_BASED_OUTPATIENT_CLINIC_OR_DEPARTMENT_OTHER)
Admission: EM | Admit: 2023-04-28 | Discharge: 2023-04-28 | Disposition: A | Discharge status: Home / Self Care | Payer: No Typology Code available for payment source | Attending: Emergency Medicine | Admitting: Emergency Medicine

## 2023-04-28 DIAGNOSIS — M25331 Other instability, right wrist: Secondary | ICD-10-CM

## 2023-04-28 DIAGNOSIS — S161XXA Strain of muscle, fascia and tendon at neck level, initial encounter: Secondary | ICD-10-CM

## 2023-04-28 DIAGNOSIS — S39012A Strain of muscle, fascia and tendon of lower back, initial encounter: Secondary | ICD-10-CM

## 2023-04-28 MED ORDER — HYDROCODONE-ACETAMINOPHEN 5-325 MG PO TABS
2.0000 | ORAL_TABLET | Freq: Once | ORAL | Status: AC
  Administered 2023-04-28: 2 via ORAL
  Filled 2023-04-28: qty 2

## 2023-04-28 MED ORDER — HYDROCODONE-ACETAMINOPHEN 5-325 MG PO TABS: 1.0000 | ORAL_TABLET | Freq: Four times a day (QID) | ORAL | 0 refills | Status: DC | PRN

## 2023-04-28 NOTE — Discharge Instructions (Signed)
Take hydrocodone as prescribed as needed for pain.  Ice your wrist for 20 minutes every 2 hours while awake for the next 2 days.  Wear wrist splint until followed up by orthopedics.  Follow-up with Dr. Denese Killings in the next 2 to 3 days.  His contact information has been provided in this discharge summary for you to call and make these arrangements.

## 2023-04-28 NOTE — ED Notes (Signed)
No obvious deformity or swelling noted to right wrist.
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
## 2023-04-29 ENCOUNTER — Emergency Department (HOSPITAL_COMMUNITY)
Admission: EM | Admit: 2023-04-29 | Discharge: 2023-04-30 | Disposition: A | Discharge status: Home / Self Care | Payer: No Typology Code available for payment source | Attending: Emergency Medicine | Admitting: Emergency Medicine

## 2023-04-29 ENCOUNTER — Other Ambulatory Visit: Payer: Self-pay

## 2023-04-29 ENCOUNTER — Emergency Department (HOSPITAL_COMMUNITY): Payer: No Typology Code available for payment source

## 2023-04-29 ENCOUNTER — Encounter (HOSPITAL_COMMUNITY): Payer: Self-pay | Admitting: Emergency Medicine

## 2023-04-29 DIAGNOSIS — S6991XA Unspecified injury of right wrist, hand and finger(s), initial encounter: Secondary | ICD-10-CM

## 2023-04-29 DIAGNOSIS — M7989 Other specified soft tissue disorders: Secondary | ICD-10-CM | POA: Insufficient documentation

## 2023-04-29 DIAGNOSIS — Y92410 Unspecified street and highway as the place of occurrence of the external cause: Secondary | ICD-10-CM | POA: Diagnosis not present

## 2023-04-29 DIAGNOSIS — S63391A Traumatic rupture of other ligament of right wrist, initial encounter: Secondary | ICD-10-CM | POA: Diagnosis not present

## 2023-04-29 MED ORDER — FENTANYL CITRATE PF 50 MCG/ML IJ SOSY
50.0000 ug | PREFILLED_SYRINGE | Freq: Once | INTRAMUSCULAR | Status: AC
  Administered 2023-04-29: 50 ug via INTRAVENOUS
  Filled 2023-04-29: qty 1

## 2023-04-29 MED ORDER — ONDANSETRON 4 MG PO TBDP: 4.0000 mg | ORAL_TABLET | Freq: Once | ORAL | Status: AC

## 2023-04-29 MED ORDER — LIDOCAINE HCL (PF) 1 % IJ SOLN
30.0000 mL | Freq: Once | INTRAMUSCULAR | Status: AC
  Administered 2023-04-29: 30 mL
  Filled 2023-04-29: qty 30

## 2023-04-29 NOTE — Discharge Instructions (Addendum)
At home take Tylenol and ibuprofen for your pain.  You may take the Norco that they prescribed you for breakthrough pain.  Follow-up with orthopedics on Monday (Dr. Merlyn Lot).  Do not get the splint wet.  Return to the emergency department if you have any severe pain or other concerning symptoms.
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
## 2023-04-29 NOTE — ED Triage Notes (Signed)
Pt here from home with c/o continued right wrist pain has not got here pain meds prescribed for from [facility]

## 2023-04-30 MED ORDER — ONDANSETRON 4 MG PO TBDP
ORAL_TABLET | ORAL | Status: AC
  Administered 2023-04-30: 4 mg via ORAL
  Filled 2023-04-30: qty 1

## 2023-04-30 NOTE — Progress Notes (Signed)
Orthopedic Tech Progress Note Patient Details:  [place] [birth year] [id number]  I'm not sure why noone ever called about splint being applied to patient, but she was discharged without a PLASTER/FIBERGLASS SPLINT but I see it was "completed"  Patient ID: Julia Watson, female   DOB: 18-Dec-1970, 52 y.o.   MRN: [id number]  Julia Watson 04/30/2023, 1:01 AM

## 2023-05-03 ENCOUNTER — Other Ambulatory Visit: Payer: Self-pay | Admitting: Orthopedic Surgery

## 2023-05-03 ENCOUNTER — Encounter (HOSPITAL_BASED_OUTPATIENT_CLINIC_OR_DEPARTMENT_OTHER): Payer: Self-pay | Admitting: Orthopedic Surgery

## 2023-05-03 ENCOUNTER — Other Ambulatory Visit: Payer: Self-pay

## 2023-05-04 NOTE — Telephone Encounter (Signed)
done

## 2023-05-05 ENCOUNTER — Other Ambulatory Visit: Payer: Self-pay

## 2023-05-05 ENCOUNTER — Encounter (HOSPITAL_BASED_OUTPATIENT_CLINIC_OR_DEPARTMENT_OTHER): Payer: Self-pay | Admitting: Orthopedic Surgery

## 2023-05-05 ENCOUNTER — Encounter (HOSPITAL_BASED_OUTPATIENT_CLINIC_OR_DEPARTMENT_OTHER)
Admission: RE | Disposition: A | Discharge status: Home / Self Care | Payer: Self-pay | Source: Home / Self Care | Attending: Orthopedic Surgery

## 2023-05-05 ENCOUNTER — Ambulatory Visit (HOSPITAL_BASED_OUTPATIENT_CLINIC_OR_DEPARTMENT_OTHER): Payer: Self-pay | Admitting: Anesthesiology

## 2023-05-05 ENCOUNTER — Ambulatory Visit (HOSPITAL_BASED_OUTPATIENT_CLINIC_OR_DEPARTMENT_OTHER): Payer: Medicaid Other | Admitting: Anesthesiology

## 2023-05-05 ENCOUNTER — Other Ambulatory Visit (HOSPITAL_COMMUNITY): Payer: Self-pay

## 2023-05-05 ENCOUNTER — Ambulatory Visit (HOSPITAL_BASED_OUTPATIENT_CLINIC_OR_DEPARTMENT_OTHER)
Admission: RE | Admit: 2023-05-05 | Discharge: 2023-05-05 | Disposition: A | Discharge status: Home / Self Care | Payer: Medicaid Other | Attending: Orthopedic Surgery | Admitting: Orthopedic Surgery

## 2023-05-05 ENCOUNTER — Ambulatory Visit (HOSPITAL_BASED_OUTPATIENT_CLINIC_OR_DEPARTMENT_OTHER): Payer: Medicaid Other

## 2023-05-05 DIAGNOSIS — G4730 Sleep apnea, unspecified: Secondary | ICD-10-CM | POA: Diagnosis not present

## 2023-05-05 DIAGNOSIS — I10 Essential (primary) hypertension: Secondary | ICD-10-CM | POA: Diagnosis not present

## 2023-05-05 DIAGNOSIS — S63591A Other specified sprain of right wrist, initial encounter: Secondary | ICD-10-CM | POA: Diagnosis present

## 2023-05-05 DIAGNOSIS — Y92410 Unspecified street and highway as the place of occurrence of the external cause: Secondary | ICD-10-CM | POA: Diagnosis not present

## 2023-05-05 DIAGNOSIS — S63036A Dislocation of midcarpal joint of unspecified wrist, initial encounter: Secondary | ICD-10-CM | POA: Diagnosis not present

## 2023-05-05 DIAGNOSIS — E785 Hyperlipidemia, unspecified: Secondary | ICD-10-CM | POA: Insufficient documentation

## 2023-05-05 DIAGNOSIS — M25331 Other instability, right wrist: Secondary | ICD-10-CM | POA: Insufficient documentation

## 2023-05-05 DIAGNOSIS — Z01818 Encounter for other preprocedural examination: Secondary | ICD-10-CM

## 2023-05-05 HISTORY — DX: Essential (primary) hypertension: I10

## 2023-05-05 HISTORY — DX: Sleep apnea, unspecified: G47.30

## 2023-05-05 HISTORY — PX: CLOSED REDUCTION WRIST FRACTURE: SHX1091

## 2023-05-05 LAB — POCT PREGNANCY, URINE: Preg Test, Ur: NEGATIVE

## 2023-05-05 SURGERY — CLOSED REDUCTION, WRIST
Anesthesia: Regional | Site: Wrist | Laterality: Right

## 2023-05-05 MED ORDER — FENTANYL CITRATE (PF) 100 MCG/2ML IJ SOLN
INTRAMUSCULAR | Status: AC
  Filled 2023-05-05: qty 2

## 2023-05-05 MED ORDER — OXYCODONE HCL 5 MG/5ML PO SOLN: 5.0000 mg | Freq: Once | ORAL | Status: AC | PRN

## 2023-05-05 MED ORDER — ACETAMINOPHEN 325 MG PO TABS: 325.0000 mg | ORAL_TABLET | ORAL | Status: DC | PRN

## 2023-05-05 MED ORDER — BUPIVACAINE LIPOSOME 1.3 % IJ SUSP
INTRAMUSCULAR | Status: DC | PRN
Start: 2023-05-05 — End: 2023-05-05
  Administered 2023-05-05: 10 mL via PERINEURAL

## 2023-05-05 MED ORDER — LACTATED RINGERS IV SOLN: INTRAVENOUS | Status: DC

## 2023-05-05 MED ORDER — MEPERIDINE HCL 25 MG/ML IJ SOLN: 6.2500 mg | INTRAMUSCULAR | Status: DC | PRN

## 2023-05-05 MED ORDER — FENTANYL CITRATE (PF) 100 MCG/2ML IJ SOLN
INTRAMUSCULAR | Status: DC | PRN
  Administered 2023-05-05 (×2): 25 ug via INTRAVENOUS

## 2023-05-05 MED ORDER — LIDOCAINE HCL (CARDIAC) PF 100 MG/5ML IV SOSY
PREFILLED_SYRINGE | INTRAVENOUS | Status: DC | PRN
  Administered 2023-05-05: 100 mg via INTRAVENOUS

## 2023-05-05 MED ORDER — OXYCODONE HCL 5 MG PO TABS
ORAL_TABLET | ORAL | Status: AC
  Filled 2023-05-05: qty 1

## 2023-05-05 MED ORDER — FENTANYL CITRATE (PF) 100 MCG/2ML IJ SOLN
25.0000 ug | INTRAMUSCULAR | Status: DC | PRN
  Administered 2023-05-05: 50 ug via INTRAVENOUS

## 2023-05-05 MED ORDER — FENTANYL CITRATE (PF) 100 MCG/2ML IJ SOLN
100.0000 ug | Freq: Once | INTRAMUSCULAR | Status: AC
  Administered 2023-05-05: 100 ug via INTRAVENOUS

## 2023-05-05 MED ORDER — SODIUM CHLORIDE 0.9 % IV SOLN: INTRAVENOUS | Status: DC | PRN

## 2023-05-05 MED ORDER — BUPIVACAINE HCL (PF) 0.5 % IJ SOLN
INTRAMUSCULAR | Status: DC | PRN
  Administered 2023-05-05: 10 mL via PERINEURAL

## 2023-05-05 MED ORDER — ACETAMINOPHEN 160 MG/5ML PO SOLN: 325.0000 mg | ORAL | Status: DC | PRN

## 2023-05-05 MED ORDER — MIDAZOLAM HCL 2 MG/2ML IJ SOLN
2.0000 mg | Freq: Once | INTRAMUSCULAR | Status: AC
  Administered 2023-05-05: 2 mg via INTRAVENOUS

## 2023-05-05 MED ORDER — OXYCODONE HCL 5 MG PO TABS
5.0000 mg | ORAL_TABLET | Freq: Once | ORAL | Status: AC | PRN
  Administered 2023-05-05: 5 mg via ORAL

## 2023-05-05 MED ORDER — ONDANSETRON HCL 4 MG/2ML IJ SOLN
INTRAMUSCULAR | Status: AC
  Filled 2023-05-05: qty 2

## 2023-05-05 MED ORDER — LIDOCAINE 2% (20 MG/ML) 5 ML SYRINGE
INTRAMUSCULAR | Status: AC
  Filled 2023-05-05: qty 15

## 2023-05-05 MED ORDER — CEFAZOLIN SODIUM-DEXTROSE 2-4 GM/100ML-% IV SOLN
2.0000 g | INTRAVENOUS | Status: AC
  Administered 2023-05-05: 2 g via INTRAVENOUS

## 2023-05-05 MED ORDER — ONDANSETRON HCL 4 MG/2ML IJ SOLN
INTRAMUSCULAR | Status: DC | PRN
  Administered 2023-05-05: 4 mg via INTRAVENOUS

## 2023-05-05 MED ORDER — MIDAZOLAM HCL 2 MG/2ML IJ SOLN
INTRAMUSCULAR | Status: AC
  Filled 2023-05-05: qty 2

## 2023-05-05 MED ORDER — ONDANSETRON HCL 4 MG PO TABS
4.0000 mg | ORAL_TABLET | Freq: Three times a day (TID) | ORAL | 0 refills | Status: AC | PRN
  Filled 2023-05-05: qty 20, 7d supply, fill #0

## 2023-05-05 MED ORDER — CEFAZOLIN SODIUM-DEXTROSE 2-4 GM/100ML-% IV SOLN
INTRAVENOUS | Status: AC
  Filled 2023-05-05: qty 100

## 2023-05-05 MED ORDER — DEXAMETHASONE SODIUM PHOSPHATE 10 MG/ML IJ SOLN
INTRAMUSCULAR | Status: AC
  Filled 2023-05-05: qty 1

## 2023-05-05 MED ORDER — DEXAMETHASONE SODIUM PHOSPHATE 10 MG/ML IJ SOLN
INTRAMUSCULAR | Status: DC | PRN
  Administered 2023-05-05: 10 mg via INTRAVENOUS

## 2023-05-05 MED ORDER — HYDROCODONE-ACETAMINOPHEN 5-325 MG PO TABS
1.0000 | ORAL_TABLET | Freq: Four times a day (QID) | ORAL | 0 refills | Status: AC | PRN
  Filled 2023-05-05: qty 20, 3d supply, fill #0

## 2023-05-05 MED ORDER — 0.9 % SODIUM CHLORIDE (POUR BTL) OPTIME
TOPICAL | Status: DC | PRN
  Administered 2023-05-05: 200 mL

## 2023-05-05 MED ORDER — LIDOCAINE 2% (20 MG/ML) 5 ML SYRINGE
INTRAMUSCULAR | Status: AC
  Filled 2023-05-05: qty 5

## 2023-05-05 MED ORDER — PROPOFOL 10 MG/ML IV BOLUS
INTRAVENOUS | Status: DC | PRN
  Administered 2023-05-05: 150 mg via INTRAVENOUS

## 2023-05-05 MED ORDER — ONDANSETRON HCL 4 MG/2ML IJ SOLN: 4.0000 mg | Freq: Once | INTRAMUSCULAR | Status: DC | PRN

## 2023-05-05 SURGICAL SUPPLY — 74 items
ANCH SUT 2-0 MN NDL DRL PLSTR (Anchor) ×2 IMPLANT
ANCHOR JUGGERKNOT 1.0 1DR 2-0 (Anchor) IMPLANT
APL PRP STRL LF DISP 70% ISPRP (MISCELLANEOUS) ×1
BLADE MINI RND TIP GREEN BEAV (BLADE) ×1 IMPLANT
BLADE SURG 15 STRL LF DISP TIS (BLADE) ×2 IMPLANT
BLADE SURG 15 STRL SS (BLADE) ×2
BNDG CMPR 5X2 KNTD ELC UNQ LF (GAUZE/BANDAGES/DRESSINGS)
BNDG CMPR 5X3 KNIT ELC UNQ LF (GAUZE/BANDAGES/DRESSINGS) ×1
BNDG CMPR 9X4 STRL LF SNTH (GAUZE/BANDAGES/DRESSINGS) ×1
BNDG ELASTIC 2INX 5YD STR LF (GAUZE/BANDAGES/DRESSINGS) IMPLANT
BNDG ELASTIC 3INX 5YD STR LF (GAUZE/BANDAGES/DRESSINGS) IMPLANT
BNDG ESMARK 4X9 LF (GAUZE/BANDAGES/DRESSINGS) ×1 IMPLANT
BNDG GAUZE DERMACEA FLUFF 4 (GAUZE/BANDAGES/DRESSINGS) ×1 IMPLANT
BNDG GZE DERMACEA 4 6PLY (GAUZE/BANDAGES/DRESSINGS) ×1
CATH ROBINSON RED A/P 8FR (CATHETERS) IMPLANT
CHLORAPREP W/TINT 26 (MISCELLANEOUS) ×1 IMPLANT
CORD BIPOLAR FORCEPS 12FT (ELECTRODE) ×1 IMPLANT
COVER BACK TABLE 60X90IN (DRAPES) ×1 IMPLANT
COVER MAYO STAND STRL (DRAPES) ×1 IMPLANT
CUFF TOURN SGL QUICK 18X4 (TOURNIQUET CUFF) ×1 IMPLANT
DRAPE EXTREMITY T 121X128X90 (DISPOSABLE) ×1 IMPLANT
DRAPE OEC MINIVIEW 54X84 (DRAPES) IMPLANT
DRAPE SURG 17X23 STRL (DRAPES) ×1 IMPLANT
GAUZE PAD ABD 8X10 STRL (GAUZE/BANDAGES/DRESSINGS) IMPLANT
GAUZE SPONGE 4X4 12PLY STRL (GAUZE/BANDAGES/DRESSINGS) ×1 IMPLANT
GAUZE XEROFORM 1X8 LF (GAUZE/BANDAGES/DRESSINGS) ×1 IMPLANT
GLOVE BIO SURGEON STRL SZ7.5 (GLOVE) ×1 IMPLANT
GLOVE BIOGEL PI IND STRL 6.5 (GLOVE) IMPLANT
GLOVE BIOGEL PI IND STRL 7.0 (GLOVE) IMPLANT
GLOVE BIOGEL PI IND STRL 8 (GLOVE) ×1 IMPLANT
GLOVE BIOGEL PI IND STRL 8.5 (GLOVE) IMPLANT
GLOVE SURG ORTHO 8.0 STRL STRW (GLOVE) IMPLANT
GLOVE SURG SS PI 6.5 STRL IVOR (GLOVE) IMPLANT
GOWN STRL REUS W/ TWL LRG LVL3 (GOWN DISPOSABLE) ×1 IMPLANT
GOWN STRL REUS W/TWL LRG LVL3 (GOWN DISPOSABLE) ×1
GOWN STRL REUS W/TWL XL LVL3 (GOWN DISPOSABLE) ×1 IMPLANT
K-WIRE DBL .035X4 NSTRL (WIRE)
K-WIRE DBL .045X4 NSTRL (WIRE) ×2
KIT BUTTON SUT MICROLINK LP (Anchor) IMPLANT
KWIRE DBL .035X4 NSTRL (WIRE) IMPLANT
KWIRE DBL .045X4 NSTRL (WIRE) IMPLANT
NDL HYPO 25X1 1.5 SAFETY (NEEDLE) IMPLANT
NDL KEITH (NEEDLE) IMPLANT
NDL SAFETY ECLIPSE 18X1.5 (NEEDLE) IMPLANT
NEEDLE HYPO 25X1 1.5 SAFETY (NEEDLE)
NEEDLE KEITH (NEEDLE)
NS IRRIG 1000ML POUR BTL (IV SOLUTION) ×1 IMPLANT
PACK BASIN DAY SURGERY FS (CUSTOM PROCEDURE TRAY) ×1 IMPLANT
PAD CAST 3X4 CTTN HI CHSV (CAST SUPPLIES) ×1 IMPLANT
PAD CAST 4YDX4 CTTN HI CHSV (CAST SUPPLIES) IMPLANT
PADDING CAST ABS COTTON 4X4 ST (CAST SUPPLIES) ×1 IMPLANT
PADDING CAST COTTON 3X4 STRL (CAST SUPPLIES) ×1
PADDING CAST COTTON 4X4 STRL (CAST SUPPLIES)
PASSER SUT SWANSON 36MM LOOP (INSTRUMENTS) IMPLANT
SLEEVE SCD COMPRESS KNEE MED (STOCKING) IMPLANT
SLING ARM FOAM STRAP MED (SOFTGOODS) IMPLANT
SPIKE FLUID TRANSFER (MISCELLANEOUS) IMPLANT
SPLINT PLASTER CAST XFAST 3X15 (CAST SUPPLIES) ×1 IMPLANT
STOCKINETTE 4X48 STRL (DRAPES) ×1 IMPLANT
SUT ETHIBOND 3-0 V-5 (SUTURE) IMPLANT
SUT ETHILON 3 0 PS 1 (SUTURE) IMPLANT
SUT ETHILON 4 0 PS 2 18 (SUTURE) IMPLANT
SUT FIBERWIRE 2-0 18 17.9 3/8 (SUTURE)
SUT MERSILENE 2.0 SH NDLE (SUTURE) IMPLANT
SUT MERSILENE 4 0 P 3 (SUTURE) IMPLANT
SUT PROLENE 4 0 PS 2 18 (SUTURE) IMPLANT
SUT SILK 4 0 PS 2 (SUTURE) IMPLANT
SUT VIC AB 4-0 PS2 18 (SUTURE) IMPLANT
SUT VICRYL 0 SH 27 (SUTURE) IMPLANT
SUTURE FIBERWR 2-0 18 17.9 3/8 (SUTURE) IMPLANT
SYR BULB EAR ULCER 3OZ GRN STR (SYRINGE) ×1 IMPLANT
SYR CONTROL 10ML LL (SYRINGE) IMPLANT
TOWEL GREEN STERILE FF (TOWEL DISPOSABLE) ×2 IMPLANT
UNDERPAD 30X36 HEAVY ABSORB (UNDERPADS AND DIAPERS) ×1 IMPLANT

## 2023-05-05 NOTE — Anesthesia Procedure Notes (Signed)
Anesthesia Regional Block: Supraclavicular block   Pre-Anesthetic Checklist: , timeout performed,  Correct Patient, Correct Site, Correct Laterality,  Correct Procedure, Correct Position, site marked,  Risks and benefits discussed,  Surgical consent,  Pre-op evaluation,  At surgeon's request and post-op pain management  Laterality: Right  Prep: chloraprep       Needles:  Injection technique: Single-shot  Needle Type: Echogenic Stimulator Needle     Needle Length: 5cm  Needle Gauge: 22     Additional Needles:   Procedures:, nerve stimulator,,, ultrasound used (permanent image in chart),,     Nerve Stimulator or Paresthesia:  Response: hand, 0.45 mA  Additional Responses:   Narrative:  Start time: 05/05/2023 12:50 PM End time: 05/05/2023 12:55 PM Injection made incrementally with aspirations every 5 mL.  Performed by: Personally  Anesthesiologist: Bethena Midget, MD  Additional Notes: Functioning IV was confirmed and monitors were applied.  A 50mm 22ga Arrow echogenic stimulator needle was used. Sterile prep and drape,hand hygiene and sterile gloves were used. Ultrasound guidance: relevant anatomy identified, needle position confirmed, local anesthetic spread visualized around nerve(s)., vascular puncture avoided.  Image printed for medical record. Negative aspiration and negative test dose prior to incremental administration of local anesthetic. The patient tolerated the procedure well.

## 2023-05-05 NOTE — Transfer of Care (Signed)
Immediate Anesthesia Transfer of Care Note  Patient: [place]  Procedure(s) Performed: Right wrist reduction and stabilization perilunate dislocation, scapholunate repair wrist pinning (Right: Wrist)  Patient Location: PACU  Anesthesia Type:GA combined with regional for post-op pain  Level of Consciousness: drowsy, patient cooperative, and responds to stimulation  Airway & Oxygen Therapy: Patient Spontanous Breathing and Patient connected to face mask oxygen  Post-op Assessment: Report given to RN and Post -op Vital signs reviewed and stable  Post vital signs: Reviewed and stable  Last Vitals:  Vitals Value Taken Time  BP 125/88 05/05/23 1638  Temp    Pulse 90 05/05/23 1638  Resp 18 05/05/23 1638  SpO2 100 % 05/05/23 1638  Vitals shown include unfiled device data.  Last Pain:  Vitals:   05/05/23 1206  TempSrc: Temporal  PainSc: 7       Patients Stated Pain Goal: 4 (05/05/23 1206)  Complications: No notable events documented.

## 2023-05-05 NOTE — Anesthesia Postprocedure Evaluation (Signed)
Anesthesia Post Note  Patient: Julia Watson  Procedure(s) Performed: Right wrist reduction and stabilization perilunate dislocation, scapholunate repair wrist pinning (Right: Wrist)     Patient location during evaluation: PACU Anesthesia Type: Regional and General Level of consciousness: awake and alert Pain management: pain level controlled Vital Signs Assessment: post-procedure vital signs reviewed and stable Respiratory status: spontaneous breathing, nonlabored ventilation, respiratory function stable and patient connected to nasal cannula oxygen Cardiovascular status: blood pressure returned to baseline and stable Postop Assessment: no apparent nausea or vomiting Anesthetic complications: no   No notable events documented.  Last Vitals:  Vitals:   05/05/23 1651 05/05/23 1700  BP:  (!) 156/81  Pulse: 80 80  Resp: 16 18  Temp:    SpO2: 100% 99%    Last Pain:  Vitals:   05/05/23 1642  TempSrc:   PainSc: 8                  [name]

## 2023-05-05 NOTE — Anesthesia Preprocedure Evaluation (Addendum)
Anesthesia Evaluation  Patient identified by MRN, date of birth, ID band Patient awake    Reviewed: Allergy & Precautions, H&P , NPO status , Patient's Chart, lab work & pertinent test results  Airway Mallampati: II  TM Distance: >3 FB Neck ROM: full    Dental  (+) Caps,    Pulmonary asthma , sleep apnea    Pulmonary exam normal breath sounds clear to auscultation       Cardiovascular Exercise Tolerance: Good hypertension, Pt. on medications Normal cardiovascular exam Rhythm:Regular     Neuro/Psych negative neurological ROS  negative psych ROS   GI/Hepatic negative GI ROS, Neg liver ROS,,,  Endo/Other  negative endocrine ROS    Renal/GU negative Renal ROS  negative genitourinary   Musculoskeletal negative musculoskeletal ROS (+)    Abdominal Normal abdominal exam  (+)   Peds negative pediatric ROS (+)  Hematology negative hematology ROS (+)   Anesthesia Other Findings      Reproductive/Obstetrics negative OB ROS                             Anesthesia Physical Anesthesia Plan  ASA: 3  Anesthesia Plan: General   Post-op Pain Management: Regional block*, Tylenol PO (pre-op)* and Celebrex PO (pre-op)*   Induction: Intravenous  PONV Risk Score and Plan: 3 and Ondansetron, Dexamethasone and Treatment may vary due to age or medical condition  Airway Management Planned: Oral ETT and LMA  Additional Equipment: None  Intra-op Plan:   Post-operative Plan: Extubation in OR  Informed Consent: I have reviewed the patients History and Physical, chart, labs and discussed the procedure including the risks, benefits and alternatives for the proposed anesthesia with the patient or authorized representative who has indicated his/her understanding and acceptance.     Dental Advisory Given  Plan Discussed with: CRNA and Anesthesiologist  Anesthesia Plan Comments: (Discussed both nerve  block for pain relief post-op and GA; including NV, sore throat, dental injury, and pulmonary complications)        Anesthesia Quick Evaluation
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
## 2023-05-05 NOTE — Progress Notes (Signed)
Assisted Dr. Oddono with right, supraclavicular, ultrasound guided block. Side rails up, monitors on throughout procedure. See vital signs in flow sheet. Tolerated Procedure well. 

## 2023-05-05 NOTE — Op Note (Signed)
I assisted Surgeons and Role:    [name], MD - Primary    Cindee Salt, MD - Assisting on the Procedure(s): Right wrist reduction and stabilization perilunate dislocation, scapholunate repair wrist pinning on 05/05/2023.  I provided assistance on this case as follows: Set up, approach, application of the scapholunate ligament injury the hamate capitate injury, debridement of the joint, reduction of the capitate hamate injury with pin fixation, reduction stabilization and repair of the scapholunate ligament with placement of a frontal splint using micro link suture, stabilization of the fluid rotation using pins and closure of the wound and application of the dressing and splints.  Electronically signed by: Cindee Salt, MD Date: 05/05/2023 Time: 4:29 PM
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
## 2023-05-05 NOTE — Discharge Instructions (Addendum)
[facility] Center Instructions Hand Surgery  Wound Care: Keep your hand elevated above the level of your heart.  Do not allow it to dangle by your side.  Keep the dressing dry and do not remove it unless your doctor advises you to do so.  He will usually change it at the time of your post-op visit.  Moving your fingers is advised to stimulate circulation but will depend on the site of your surgery.  If you have a splint applied, your doctor will advise you regarding movement.  Activity: Do not drive or operate machinery today.  Rest today and then you may return to your normal activity and work as indicated by your physician.  Diet:  Drink liquids today or eat a light diet.  You may resume a regular diet tomorrow.    General expectations: Pain for two to three days. Fingers may become slightly swollen.  Call your doctor if any of the following occur: Severe pain not relieved by pain medication. Elevated temperature. Dressing soaked with blood. Inability to move fingers. White or bluish color to fingers.    Post Anesthesia Home Care Instructions  Activity: Get plenty of rest for the remainder of the day. A responsible individual must stay with you for 24 hours following the procedure.  For the next 24 hours, DO NOT: -Drive a car -Advertising copywriter -Drink alcoholic beverages -Take any medication unless instructed by your physician -Make any legal decisions or sign important papers.  Meals: Start with liquid foods such as gelatin or soup. Progress to regular foods as tolerated. Avoid greasy, spicy, heavy foods. If nausea and/or vomiting occur, drink only clear liquids until the nausea and/or vomiting subsides. Call your physician if vomiting continues.  Special Instructions/Symptoms: Your throat may feel dry or sore from the anesthesia or the breathing tube placed in your throat during surgery. If this causes discomfort, gargle with warm salt water. The discomfort should disappear  within 24 hours.     Regional Anesthesia Blocks  1. You may not be able to move or feel the "blocked" extremity after a regional anesthetic block. This may last may last from 3-48 hours after placement, but it will go away. The length of time depends on the medication injected and your individual response to the medication. As the nerves start to wake up, you may experience tingling as the movement and feeling returns to your extremity. If the numbness and inability to move your extremity has not gone away after 48 hours, please call your surgeon.   2. The extremity that is blocked will need to be protected until the numbness is gone and the strength has returned. Because you cannot feel it, you will need to take extra care to avoid injury. Because it may be weak, you may have difficulty moving it or using it. You may not know what position it is in without looking at it while the block is in effect.  3. For blocks in the legs and feet, returning to weight bearing and walking needs to be done carefully. You will need to wait until the numbness is entirely gone and the strength has returned. You should be able to move your leg and foot normally before you try and bear weight or walk. You will need someone to be with you when you first try to ensure you do not fall and possibly risk injury.  4. Bruising and tenderness at the needle site are common side effects and will resolve in a few days.  5. Persistent numbness or new problems with movement should be communicated to the surgeon or the [facility] [id number] [facility] [phone]).

## 2023-05-05 NOTE — Anesthesia Procedure Notes (Signed)
Procedure Name: LMA Insertion Date/Time: 05/05/2023 2:11 PM  Performed by: Lauralyn Primes, CRNAPre-anesthesia Checklist: Patient identified, Emergency Drugs available, Suction available and Patient being monitored Patient Re-evaluated:Patient Re-evaluated prior to induction Oxygen Delivery Method: Circle system utilized Preoxygenation: Pre-oxygenation with 100% oxygen Induction Type: IV induction Ventilation: Mask ventilation without difficulty LMA: LMA inserted LMA Size: 4.0 Number of attempts: 1 Airway Equipment and Method: Bite block Placement Confirmation: positive ETCO2 Tube secured with: Tape Dental Injury: Teeth and Oropharynx as per pre-operative assessment

## 2023-05-06 ENCOUNTER — Encounter (HOSPITAL_BASED_OUTPATIENT_CLINIC_OR_DEPARTMENT_OTHER): Payer: Self-pay | Admitting: Orthopedic Surgery

## 2024-03-08 ENCOUNTER — Telehealth: Payer: Self-pay | Admitting: Physician Assistant

## 2024-03-08 NOTE — Telephone Encounter (Signed)
 Left vm and sent mychart message to confirm 03/15/24 appointment-Julia Watson

## 2024-03-15 ENCOUNTER — Ambulatory Visit: Payer: Medicaid Other | Admitting: Physician Assistant

## 2024-03-15 ENCOUNTER — Encounter: Payer: Self-pay | Admitting: Physician Assistant

## 2024-03-15 VITALS — BP 121/68 | HR 85 | Temp 98.5°F | Resp 16 | Ht 60.0 in | Wt 112.0 lb

## 2024-03-15 DIAGNOSIS — R5383 Other fatigue: Secondary | ICD-10-CM

## 2024-03-15 DIAGNOSIS — Z0001 Encounter for general adult medical examination with abnormal findings: Secondary | ICD-10-CM | POA: Diagnosis not present

## 2024-03-15 DIAGNOSIS — Z1231 Encounter for screening mammogram for malignant neoplasm of breast: Secondary | ICD-10-CM | POA: Diagnosis not present

## 2024-03-15 DIAGNOSIS — Z111 Encounter for screening for respiratory tuberculosis: Secondary | ICD-10-CM
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
## 2024-04-26 ENCOUNTER — Ambulatory Visit
Admission: RE | Admit: 2024-04-26 | Discharge: 2024-04-26 | Disposition: A | Discharge status: Home / Self Care | Source: Ambulatory Visit | Attending: Physician Assistant | Admitting: Physician Assistant

## 2024-04-26 ENCOUNTER — Ambulatory Visit
Admission: RE | Admit: 2024-04-26 | Discharge: 2024-04-26 | Disposition: A | Discharge status: Home / Self Care | Attending: Physician Assistant | Admitting: Physician Assistant

## 2024-04-26 DIAGNOSIS — Z111 Encounter for screening for respiratory tuberculosis: Secondary | ICD-10-CM | POA: Insufficient documentation

## 2024-05-01 ENCOUNTER — Ambulatory Visit: Payer: Self-pay | Admitting: Physician Assistant

## 2024-05-02 NOTE — Telephone Encounter (Signed)
-----   Message from Tinnie MARLA Pro sent at 05/01/2024  4:11 PM EST ----- Please let her know chest xray normal ----- Message ----- From: Interface, Rad Results In Sent: 04/27/2024   3:21 PM EST To: Tinnie MARLA Pro, PA-C

## 2024-05-02 NOTE — Telephone Encounter (Signed)
 LVM for patient regarding normal CXR.

## 2025-03-18 ENCOUNTER — Encounter: Admitting: Physician Assistant
